# Patient Record
Sex: Female | Born: 1975 | Race: White | Hispanic: Yes | Smoking: Never smoker
Health system: Southern US, Community
[De-identification: ages and names within clinical notes are randomized; demographics above are authoritative.]

## PROBLEM LIST (undated history)

## (undated) DIAGNOSIS — M329 Systemic lupus erythematosus, unspecified: Secondary | ICD-10-CM

## (undated) DIAGNOSIS — IMO0002 Reserved for concepts with insufficient information to code with codable children: Secondary | ICD-10-CM

## (undated) DIAGNOSIS — D649 Anemia, unspecified: Secondary | ICD-10-CM

## (undated) DIAGNOSIS — N189 Chronic kidney disease, unspecified: Secondary | ICD-10-CM

## (undated) DIAGNOSIS — Z975 Presence of (intrauterine) contraceptive device: Secondary | ICD-10-CM

## (undated) DIAGNOSIS — Z8619 Personal history of other infectious and parasitic diseases: Secondary | ICD-10-CM

## (undated) HISTORY — DX: Chronic kidney disease, unspecified: N18.9

## (undated) HISTORY — DX: Systemic lupus erythematosus, unspecified: M32.9

## (undated) HISTORY — DX: Reserved for concepts with insufficient information to code with codable children: IMO0002

## (undated) HISTORY — PX: WISDOM TOOTH EXTRACTION: SHX21

---

## 2011-02-07 HISTORY — PX: RENAL BIOPSY: SHX156

## 2012-02-08 ENCOUNTER — Ambulatory Visit: Payer: Self-pay | Admitting: Pulmonary Disease

## 2012-02-08 LAB — URINALYSIS, COMPLETE
Bilirubin,UR: NEGATIVE
Ketone: NEGATIVE
Leukocyte Esterase: NEGATIVE
Nitrite: NEGATIVE
Ph: 5 (ref 4.5–8.0)
Specific Gravity: 1.012 (ref 1.003–1.030)
Squamous Epithelial: 1
WBC UR: <1 /HPF (ref 0–5)

## 2012-02-08 LAB — PROTEIN / CREATININE RATIO, URINE
Protein, Random Urine: 97 mg/dL — ABNORMAL HIGH (ref 0–12)
Protein/Creat. Ratio: 1370 mg/gCREAT — ABNORMAL HIGH (ref 0–200)

## 2012-02-08 LAB — CBC WITH DIFFERENTIAL/PLATELET
Basophil #: 0 10*3/uL (ref 0.0–0.1)
Basophil %: 0.5 %
Lymphocyte %: 26.9 %
Monocyte %: 6.1 %
Neutrophil #: 5.8 10*3/uL (ref 1.4–6.5)
Neutrophil %: 65.2 %
RBC: 4.22 10*6/uL (ref 3.80–5.20)
RDW: 13.5 % (ref 11.5–14.5)

## 2012-02-08 LAB — PROTIME-INR: Prothrombin Time: 12.2 secs (ref 11.5–14.7)

## 2012-02-08 LAB — APTT: Activated PTT: 30.1 secs (ref 23.6–35.9)

## 2012-02-10 ENCOUNTER — Observation Stay: Payer: Self-pay | Admitting: Nephrology

## 2012-02-10 LAB — URINALYSIS, COMPLETE
Bacteria: NONE SEEN
Glucose,UR: NEGATIVE mg/dL (ref 0–75)
Leukocyte Esterase: NEGATIVE
Nitrite: NEGATIVE
Protein: 100
RBC,UR: 7377 /HPF (ref 0–5)
Specific Gravity: 1.011 (ref 1.003–1.030)

## 2012-02-10 LAB — CBC WITH DIFFERENTIAL/PLATELET
Basophil #: 0.1 10*3/uL (ref 0.0–0.1)
Basophil %: 1.3 %
Eosinophil #: 0.1 10*3/uL (ref 0.0–0.7)
Lymphocyte %: 39.2 %
MCHC: 33.3 g/dL (ref 32.0–36.0)
Monocyte #: 0.2 x10 3/mm (ref 0.2–0.9)
Neutrophil %: 55.7 %
RDW: 13.8 % (ref 11.5–14.5)

## 2012-02-10 LAB — PROTEIN / CREATININE RATIO, URINE
Creatinine, Urine: 59.9 mg/dL (ref 30.0–125.0)
Protein, Random Urine: 254 mg/dL — ABNORMAL HIGH (ref 0–12)

## 2012-02-10 LAB — PROTIME-INR: INR: 0.9

## 2012-02-10 LAB — COMPREHENSIVE METABOLIC PANEL
Alkaline Phosphatase: 87 U/L (ref 50–136)
Anion Gap: 5 — ABNORMAL LOW (ref 7–16)
BUN: 14 mg/dL (ref 7–18)
Co2: 26 mmol/L (ref 21–32)
Creatinine: 0.59 mg/dL — ABNORMAL LOW (ref 0.60–1.30)
EGFR (Non-African Amer.): >60
Glucose: 89 mg/dL (ref 65–99)
Osmolality: 276 (ref 275–301)
SGOT(AST): 24 U/L (ref 15–37)
SGPT (ALT): 21 U/L (ref 12–78)
Sodium: 138 mmol/L (ref 136–145)

## 2012-04-18 ENCOUNTER — Emergency Department: Payer: Self-pay | Admitting: Emergency Medicine

## 2012-04-18 LAB — URINALYSIS, COMPLETE
Ketone: NEGATIVE
Ph: 5 (ref 4.5–8.0)
Protein: 150
Specific Gravity: 1.035 (ref 1.003–1.030)
Squamous Epithelial: 9
WBC UR: 25 /HPF (ref 0–5)

## 2012-04-18 LAB — COMPREHENSIVE METABOLIC PANEL
Alkaline Phosphatase: 61 U/L (ref 50–136)
Anion Gap: 8 (ref 7–16)
BUN: 21 mg/dL — ABNORMAL HIGH (ref 7–18)
Bilirubin,Total: 0.2 mg/dL (ref 0.2–1.0)
Calcium, Total: 8 mg/dL — ABNORMAL LOW (ref 8.5–10.1)
Creatinine: 0.69 mg/dL (ref 0.60–1.30)
Glucose: 114 mg/dL — ABNORMAL HIGH (ref 65–99)
Osmolality: 283 (ref 275–301)
Potassium: 3.9 mmol/L (ref 3.5–5.1)
Sodium: 140 mmol/L (ref 136–145)

## 2012-04-18 LAB — CBC
HGB: 12 g/dL (ref 12.0–16.0)
MCH: 27.4 pg (ref 26.0–34.0)
MCHC: 32 g/dL (ref 32.0–36.0)
MCV: 86 fL (ref 80–100)
RBC: 4.36 10*6/uL (ref 3.80–5.20)
WBC: 11.2 10*3/uL — ABNORMAL HIGH (ref 3.6–11.0)

## 2012-04-18 LAB — SEDIMENTATION RATE: Erythrocyte Sed Rate: 5 mm/hr (ref 0–20)

## 2013-03-20 ENCOUNTER — Encounter: Payer: Self-pay | Admitting: Obstetrics & Gynecology

## 2014-01-04 ENCOUNTER — Telehealth: Payer: Self-pay | Admitting: Family Medicine

## 2014-01-04 NOTE — Telephone Encounter (Signed)
Pt wanted to be seen for flare up of lupus and wanted to be seen today but hasn't been seen here since 1991. Advised pt she would need new pt appointment to re-establish care. She was seeing doctor in [place] but no longer lives there. Advised it would be best to go see that doctor for flare up as he will be able to treat her flare up better as he is aware of the Lupus and has been following her. Pt will go to [facility] and advised to CB to schedule an appointment for re-establishment of care.

## 2014-06-30 NOTE — Consult Note (Signed)
Referral Information:  Reason for Referral Management of lupus in pregnancy   Referring Physician [facility]   Prenatal Hx Meghan Foster is a 39 year-old G3 P2002 at 7 6/7 weeks by LMP of 01/23/13 (live IUP seen on US performed at [facility] on 03/14/12 with estimated measurements of 6 2/7 weeks) who presents for recommendations regarding the management of lupus in pregnancy.  Meghan Foster was diagnosed with SLE in late 2013 though reports having symptoms for a few years prior. She reports symptoms of edema and extremity pain that began shortly after her most recent delivery in 2007.  She was living in [place] and then moved to the area.  She has had renal invovlement of her lupus.  Her urine P/C ratios range from 400 mg up to 4 g over the last 1-2 years. 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 In addition to proteinuria her UA's have demonstrated micorscopic hematruia and casts.  She denies ever having elevated serum creatinines.  She is currently on Prednisone, Plaquenil and CellCept.  The CellCept has been needed to help with her kidney function. She is also taking an ACE inhibitor.  Meghan Foster is here with her mother. She understands that she was to use contraception on CellCept and is concerned not only about the effects of her medication regimen on her fetus but also the risk of the pregnancy on her health.   Past Obstetrical Hx G3 P2002 2002:  spontaneous vaginal delivery at 37 weeks in setting of SROM.  Female infant weighing 5 pounds 8 ounces. Denies complications 4196: spontaneous vaginal delivery at 40 weeks. Female infant. No complications No diabetes or preeclampsia during either pregnancy.   Home Medications: Medication Instructions Status  predniSONE 5 mg oral tablet 1  orally 2 times a day Active  hydroxychloroquine 200 mg oral tablet 1 tab(s) orally once a day Active  mycophenolate mofetil 500 mg oral tablet 3 tab(s) orally 2 times a day Active   vitamin d 50,000iu 1 cap(s)  once a month Active  ramipril 10 mg oral capsule 1 cap(s) orally once a day Active  omeprazole 40 mg oral delayed release capsule 1 cap(s) orally once a day Active   Allergies:   No Known Allergies:   Vital Signs/Notes:  Nursing Vital Signs: **Vital Signs.:   12-Jan-15 09:57  Pulse Pulse 86  Systolic BP Systolic BP 222  Diastolic BP (mmHg) Diastolic BP (mmHg) 87   Perinatal Consult:  Past Medical History cont'd 1. SLE. Diagnosed in 2013.  Complicated by renal nephritis. Urine P/C ranges from 400 mg to 4 g over last 1-2 years. Serum creatinine normal in late 2013 2. Chronic HTN.   PSurg Hx 1. Renal biopsy, December 2013;   2. Wisdom teeth extraction, no complications   FHx Denies a FH of birth defects, genetic disorders, or mentral retardation.  Has a daughter with autism.  Paternal aunts with lupus. No FH of thromboembolic phenomena   Soc Hx Single. Denies use of ETOH, tobacco or drugs   Review Of Systems:  Subjective Denies SOB or Chest pain.  Stable SLE symptoms of extremeity discomfort.   Fever/Chills No   Cough No   Abdominal Pain No   Nausea/Vomiting No   SOB/DOE No   Chest Pain No   Medications/Allergies Reviewed Medications/Allergies reviewed    Additional Lab/Radiology Notes Per [facility] Rheum note dated 12/23/11:  SSA positive, SSB negative ANA: 1: 160 (1013)  Labs [facility] (12/13): Hct 35.4, MCV 84, Plt 326,  BUN 14, Cr 0.59, ALT 21, AST 24, glucose 89  Urine P/C 1370 (02/08/12) Urine P/C 4240 (02/10/12) Urine P/C per [place] note dated 02/27/13: 788.   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  SSA positive.  Unknown anti-phospholipid antibody state   Recommendations 1.  Advanced Maternal Age.   We discussed the age-associated risks for aneuploidy and medical complications of pregnancy. We discussed the availability of genetic counseling and screening and diagnostics tests for aneuploidy.  She currently elects for pregnancy termination so does not desire genetic counseling or aneuploidy testing  2. SLE with lupus nephritis.  We discussed the impact of SLE on pregnancy and the impact of pregnancy on SLE.  Given her baseline renal invovlement, she is at risk for severe, early preeclampsia as well as for renal deterioration in pregnnacy.  It is not possible to predict if she would devlop either worsening renal function or preeclampsia.  If she did develop worsening renal fucntion, this could progress to requiring dialysis or permanent renal failure.  We discussed that the cure for preeclampsia is delivery.  Should she develop preeclampsia, this could result in the delivery of a periviable or early preterm delivery leading to life-long handicaps for the child. In addition, early-severe forms of preeclampsia can put the mother's life at risk.  Next, we discussed the association of CellCept with both first trimester loss and risk for congenital anomalies. It is not possible to predict the absolute risk for congenital anomalies as not all fetuses exposed to CellCept have anomalies.  Prednisone is associated with a small increased risk for oral clefting.  Plaquenil is though to be safe in pregnancy.  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 Maternal SSA carriage is associated with a risk for congenital heart block in the neonate.  We typically screen women from 18 to 30 weeks every 1-2 weeks with fetal echo to measure the mechanical PR interval.    Comments ...continued from above...  We discussed that should she elect to continue the pregnancy that this would be a high-risk pregnancy and that we would be happy to take care of her in our  [facility] office. We would work with her Rheumatologist to consider other immunosupresents besides CellCept that would provide her with the therapy that she needs and with lower risk for the fetus.  3. Chronic Hypertensin. Meghan Foster is currently taking an ACE Inihibtor. We discussed the associated anomalies and pregnancy complications associated with prolonged ACE Inhibitor usage in pregnancy.  After answering all of hers and her mother's questions, Meghan Foster desires to proceed wtih pregnancy termination. She states she will contact [facility] to arrange this.  If desired, we can also arrange at the [facility].  Should she elect to continue with the pregnancy, we recommned the following: -Discontinuing CellCept now.  Discuss with Dr. Jefm Bryant other potential options -Referral back for Genetic Counseling to discuss aneuploidy testing options -Detailed antaomy scan at approximately 16 weeks to screen for congential anomalies -Obtain lupus anticoagulant, anti-cardiolipin antibody and anti-beta2 glycoprotein antibody levels -Start a daily baby aspirin to decrease the risk for preeclampsia -Discontinue her ACE inhibitor and start labetalol or nifedipine -Maternal echo -Close Rheum followup during the pregnancy -24 hour urine protein and creatinine clearance -Referall to [facility] at approximately 18 weeks for fetal echo and to schedule regular fetal echo screening through the pregnancy. We can help with scheduling her fetal echos. -Thyroid function testing    Total Time Spent with Patient 60 minutes   >50% of visit spent in couseling/coordination of care yes   Office Use Only 99244  Level 4 (29mn) NEW office consult low complexity   Coding Description: OTHER: Advanced Maternal Age, Lupus in pregnancy.  Electronic Signatures: Shai Mckenzie, CMali(MD)  (Signed 12-Jan-15 13:00)  Authored: Referral, Home Medications, Allergies, Vital Signs/Notes, Consult, Exam,  Lab/Radiology Notes, Impression, Other Comments, Billing, Coding Description   Last Updated: 12-Jan-15 13:00 by Lashayla Armes, CMali(MD)

## 2014-08-22 ENCOUNTER — Other Ambulatory Visit: Payer: 59

## 2014-08-22 ENCOUNTER — Other Ambulatory Visit: Payer: Self-pay

## 2014-08-22 DIAGNOSIS — Z113 Encounter for screening for infections with a predominantly sexual mode of transmission: Secondary | ICD-10-CM

## 2014-08-22 DIAGNOSIS — Z01419 Encounter for gynecological examination (general) (routine) without abnormal findings: Secondary | ICD-10-CM

## 2014-08-24 LAB — CBC WITH DIFFERENTIAL/PLATELET
BASOS ABS: 0 10*3/uL (ref 0.0–0.2)
Basos: 0 %
EOS (ABSOLUTE): 0.1 10*3/uL (ref 0.0–0.4)
Eos: 1 %
Hematocrit: 38.8 % (ref 34.0–46.6)
Hemoglobin: 12.5 g/dL (ref 11.1–15.9)
IMMATURE GRANS (ABS): 0 10*3/uL (ref 0.0–0.1)
IMMATURE GRANULOCYTES: 0 %
LYMPHS ABS: 2.9 10*3/uL (ref 0.7–3.1)
Lymphs: 29 %
MCH: 27.4 pg (ref 26.6–33.0)
MCHC: 32.2 g/dL (ref 31.5–35.7)
MCV: 85 fL (ref 79–97)
MONOS ABS: 0.6 10*3/uL (ref 0.1–0.9)
Monocytes: 6 %
Neutrophils Absolute: 6.2 10*3/uL (ref 1.4–7.0)
Neutrophils: 64 %
PLATELETS: 401 10*3/uL — AB (ref 150–379)
RBC: 4.57 x10E6/uL (ref 3.77–5.28)
RDW: 14 % (ref 12.3–15.4)
WBC: 9.9 10*3/uL (ref 3.4–10.8)

## 2014-08-24 LAB — LIPID PANEL
CHOL/HDL RATIO: 3.4 ratio (ref 0.0–4.4)
Cholesterol, Total: 167 mg/dL (ref 100–199)
HDL: 49 mg/dL (ref >39)
LDL Calculated: 99 mg/dL (ref 0–99)
Triglycerides: 96 mg/dL (ref 0–149)
VLDL Cholesterol Cal: 19 mg/dL (ref 5–40)

## 2014-08-24 LAB — BASIC METABOLIC PANEL
BUN / CREAT RATIO: 17 (ref 9–23)
BUN: 12 mg/dL (ref 6–24)
CALCIUM: 9.2 mg/dL (ref 8.7–10.2)
CO2: 20 mmol/L (ref 18–29)
CREATININE: 0.7 mg/dL (ref 0.57–1.00)
Chloride: 101 mmol/L (ref 97–108)
GFR calc Af Amer: 125 mL/min/{1.73_m2} (ref >59)
GFR, EST NON AFRICAN AMERICAN: 109 mL/min/{1.73_m2} (ref >59)
Glucose: 85 mg/dL (ref 65–99)
POTASSIUM: 4.6 mmol/L (ref 3.5–5.2)
Sodium: 137 mmol/L (ref 134–144)

## 2014-08-24 LAB — VITAMIN D 25 HYDROXY (VIT D DEFICIENCY, FRACTURES): Vit D, 25-Hydroxy: 17.7 ng/mL — ABNORMAL LOW (ref 30.0–100.0)

## 2014-08-24 LAB — THYROID PANEL WITH TSH
FREE THYROXINE INDEX: 2.1 (ref 1.2–4.9)
T3 UPTAKE RATIO: 26 % (ref 24–39)
T4, Total: 8 ug/dL (ref 4.5–12.0)
TSH: 1.22 u[IU]/mL (ref 0.450–4.500)

## 2014-08-24 LAB — HEP, RPR, HIV PANEL
HIV Screen 4th Generation wRfx: NONREACTIVE
Hepatitis B Surface Ag: NEGATIVE
RPR Ser Ql: NONREACTIVE

## 2014-08-27 ENCOUNTER — Telehealth: Payer: Self-pay | Admitting: Obstetrics and Gynecology

## 2014-08-27 MED ORDER — VITAMIN D (ERGOCALCIFEROL) 1.25 MG (50000 UNIT) PO CAPS: 50000.0000 [IU] | ORAL_CAPSULE | ORAL | Status: DC

## 2014-08-27 NOTE — Telephone Encounter (Signed)
Pt. Informed that Vitamin D was low and to take 50,000 IU weekly x12 weeks, followed by OTC Vitamin D supplement.  Rx sent to [facility] per pt. Request.

## 2014-08-27 NOTE — Telephone Encounter (Signed)
PT CALLED AND IS A DR CHERRY PT, SHE WAS WANTING TO KNOW THE RESULTS OF HER LAB WORK,

## 2015-01-18 ENCOUNTER — Ambulatory Visit (INDEPENDENT_AMBULATORY_CARE_PROVIDER_SITE_OTHER): Payer: PRIVATE HEALTH INSURANCE

## 2015-01-18 ENCOUNTER — Ambulatory Visit (INDEPENDENT_AMBULATORY_CARE_PROVIDER_SITE_OTHER): Payer: PRIVATE HEALTH INSURANCE | Admitting: Family Medicine

## 2015-01-18 ENCOUNTER — Encounter: Payer: Self-pay | Admitting: Family Medicine

## 2015-01-18 VITALS — BP 116/86 | HR 85 | Temp 97.8°F | Ht 61.0 in | Wt 169.6 lb

## 2015-01-18 DIAGNOSIS — S9031XA Contusion of right foot, initial encounter: Secondary | ICD-10-CM | POA: Diagnosis not present

## 2015-01-18 DIAGNOSIS — M329 Systemic lupus erythematosus, unspecified: Secondary | ICD-10-CM

## 2015-01-18 NOTE — Progress Notes (Signed)
Subjective:  Patient ID: Meghan Foster, female    DOB: 10-Jul-1975  Age: 39 y.o. MRN: [id number]  CC: Establish Care and Foot Pain   HPI Meghan Foster presents for . Drawer fell on foot 4 days ago. Painful to ambulate. Hit the base of the 2-3 right toes.  Took plaquenil, prednisone and a chemo drug. Finished 1.5 years ago. Recent nephrology appt. "In remission."  History Meghan Foster has a past medical history of Lupus ([place]); Chronic kidney disease; and Lupus ([place]).   She has no past surgical history on file.   Her family history includes Diabetes in her mother.She reports that she has never smoked. She does not have any smokeless tobacco history on file. She reports that she does not drink alcohol or use illicit drugs.  No outpatient prescriptions prior to visit.   No facility-administered medications prior to visit.   Single mom 2 girls 14, 9   ROS Review of Systems  Constitutional: Negative for fever, activity change and appetite change.  HENT: Negative for congestion, rhinorrhea and sore throat.   Eyes: Negative for pain and visual disturbance.  Respiratory: Negative for cough and shortness of breath.   Gastrointestinal: Negative for nausea and abdominal pain.  Musculoskeletal: Positive for arthralgias (R foot, Currently none from lupus). Negative for myalgias.    Objective:  BP 116/86 mmHg  Pulse 85  Temp(Src) 97.8 F (36.6 C) (Oral)  Ht 5' 1"  (1.549 m)  Wt 169 lb 9.6 oz (76.93 kg)  BMI 32.06 kg/m2  SpO2 98%  BP Readings from Last 3 Encounters:  01/18/15 116/86    Wt Readings from Last 3 Encounters:  01/18/15 169 lb 9.6 oz (76.93 kg)     Physical Exam  Constitutional: She is oriented to person, place, and time. She appears well-developed and well-nourished. No distress.  HENT:  Head: Normocephalic and atraumatic.  Right Ear: External ear normal.  Left Ear: External ear normal.  Nose: Nose normal.  Mouth/Throat: Oropharynx is clear and moist.    Eyes: Conjunctivae and EOM are normal. Pupils are equal, round, and reactive to light.  Neck: Normal range of motion. Neck supple. No thyromegaly present.  Cardiovascular: Normal rate, regular rhythm and normal heart sounds.   No murmur heard. Pulmonary/Chest: Effort normal and breath sounds normal. No respiratory distress. She has no wheezes. She has no rales.  Musculoskeletal: Normal range of motion. She exhibits edema and tenderness (at dorsal right forefoot).  Lymphadenopathy:    She has no cervical adenopathy.  Neurological: She is alert and oriented to person, place, and time. She has normal reflexes.  Skin: Skin is warm and dry.  Psychiatric: She has a normal mood and affect. Her behavior is normal. Judgment and thought content normal.    No results found for: HGBA1C  No results found for: WBC, HGB, HCT, PLT, GLUCOSE, CHOL, TRIG, HDL, LDLDIRECT, LDLCALC, ALT, AST, NA, K, CL, CREATININE, BUN, CO2, TSH, PSA, INR, GLUF, HGBA1C, MICROALBUR  Patient was never admitted.  Assessment & Plan:   Amrita was seen today for establish care and foot pain.  Diagnoses and all orders for this visit:  Contusion, foot, right, initial encounter -     DG Foot Complete Right; Future -     DME Other see comment  Lupus (systemic lupus erythematosus) ([place])   Ms. Kanode does not currently have medications on file.  No orders of the defined types were placed in this encounter.   the radiologist confirmed on her x-ray that there are  no broken bones in her foot. However to promote healing she should still wear the postop shoe as much as possible until the bruising and pain go away   Follow-up: Return if symptoms worsen or fail to improve.  Meghan Foster, M.D.

## 2015-01-20 DIAGNOSIS — M329 Systemic lupus erythematosus, unspecified: Secondary | ICD-10-CM | POA: Insufficient documentation

## 2015-02-15 ENCOUNTER — Ambulatory Visit: Payer: PRIVATE HEALTH INSURANCE | Admitting: Pharmacist

## 2015-03-12 ENCOUNTER — Other Ambulatory Visit: Payer: Self-pay | Admitting: Obstetrics and Gynecology

## 2015-03-12 DIAGNOSIS — Z1231 Encounter for screening mammogram for malignant neoplasm of breast: Secondary | ICD-10-CM

## 2015-03-15 ENCOUNTER — Encounter: Payer: Self-pay | Admitting: Obstetrics and Gynecology

## 2015-03-15 ENCOUNTER — Ambulatory Visit
Admission: RE | Admit: 2015-03-15 | Discharge: 2015-03-15 | Disposition: A | Discharge status: Home / Self Care | Payer: PRIVATE HEALTH INSURANCE | Source: Ambulatory Visit | Attending: Obstetrics and Gynecology | Admitting: Obstetrics and Gynecology

## 2015-03-15 ENCOUNTER — Ambulatory Visit (INDEPENDENT_AMBULATORY_CARE_PROVIDER_SITE_OTHER): Payer: PRIVATE HEALTH INSURANCE | Admitting: Obstetrics and Gynecology

## 2015-03-15 VITALS — BP 126/87 | HR 70 | Ht 62.0 in | Wt 171.7 lb

## 2015-03-15 DIAGNOSIS — Z1231 Encounter for screening mammogram for malignant neoplasm of breast: Secondary | ICD-10-CM | POA: Insufficient documentation

## 2015-03-15 DIAGNOSIS — A630 Anogenital (venereal) warts: Secondary | ICD-10-CM

## 2015-03-16 NOTE — Progress Notes (Signed)
      GYNECOLOGY PROGRESS NOTE  Subjective:    Patient ID: Meghan Foster, female    DOB: [birth year], 40 y.o.   MRN: [id number]  HPI  Patient is a 40 y.o. P47 female who presents for treatment for genital warts.  Patient has had a h/o genital warts in the past.  Was prescribed Vaniqua previously, however notes non-compliance with medication.  Desires treatment/removal today.  The following portions of the patient's history were reviewed and updated as appropriate: allergies, current medications, past family history, past medical history, past social history, past surgical history and problem list.  Review of Systems Pertinent items noted in HPI and remainder of comprehensive ROS otherwise negative.   Objective:   Blood pressure 126/87, pulse 70, height 5' 2"  (1.575 m), weight 171 lb 11.2 oz (77.883 kg), last menstrual period 02/18/2015. General appearance: alert and no distress Abdomen: soft, non-tender; bowel sounds normal; no masses,  no organomegaly Pelvic: external genitalia normal and positive findings: genital warts present (1 noted on right vulva at level of the clitoral hood, a second noted at perianal skin).   Extremities: extremities normal, atraumatic, no cyanosis or edema Neurologic: Grossly normal   Assessment:   Vulvar condyloma  Plan:   Patient identified, verbal informed consent obtained.    Areas of typical appearing genital warts noted on right vulva at level of the clitoral hood, a second noted at perianal skin.  Surrounding area was injected with ~ 0.5 cc of lidocaine at each wart site and coated with water based lubricant.  TCA applied until warts had white appearance.   Patient tolerated the procedure well.    Post procedure instructions given and patient told to wash area in thirty minutes.  Return in 1 week for next treatment if necessary, or advised to resume [place] treatment.  Patient also to f/u in 3 months for annual exam.     Rubie Maid, MD [facility]

## 2015-03-18 ENCOUNTER — Other Ambulatory Visit: Payer: Self-pay | Admitting: Obstetrics and Gynecology

## 2015-03-18 DIAGNOSIS — R928 Other abnormal and inconclusive findings on diagnostic imaging of breast: Secondary | ICD-10-CM

## 2015-03-22 ENCOUNTER — Ambulatory Visit (INDEPENDENT_AMBULATORY_CARE_PROVIDER_SITE_OTHER): Payer: PRIVATE HEALTH INSURANCE | Admitting: Pharmacist

## 2015-03-22 ENCOUNTER — Telehealth: Payer: Self-pay | Admitting: Family Medicine

## 2015-03-22 ENCOUNTER — Encounter: Payer: Self-pay | Admitting: Pharmacist

## 2015-03-22 DIAGNOSIS — R635 Abnormal weight gain: Secondary | ICD-10-CM

## 2015-03-22 DIAGNOSIS — E669 Obesity, unspecified: Secondary | ICD-10-CM | POA: Diagnosis not present
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
## 2015-03-27 NOTE — Telephone Encounter (Signed)
Discussed started weight loss medication with patient.  I would like to postpone for at least 1 month for 2 reasons;  1 - waiting on records from nephrologist  2 - patient has surgery scheduled for January 30th and recommend waiting until after.  Patient aware.

## 2015-04-03 ENCOUNTER — Encounter: Payer: Self-pay | Admitting: Obstetrics and Gynecology

## 2015-04-03 ENCOUNTER — Ambulatory Visit
Admission: RE | Admit: 2015-04-03 | Discharge: 2015-04-03 | Disposition: A | Discharge status: Home / Self Care | Payer: PRIVATE HEALTH INSURANCE | Source: Ambulatory Visit | Attending: Obstetrics and Gynecology | Admitting: Obstetrics and Gynecology

## 2015-04-03 ENCOUNTER — Encounter
Admission: RE | Admit: 2015-04-03 | Discharge: 2015-04-03 | Disposition: A | Discharge status: Home / Self Care | Payer: PRIVATE HEALTH INSURANCE | Source: Ambulatory Visit | Attending: Obstetrics and Gynecology | Admitting: Obstetrics and Gynecology

## 2015-04-03 ENCOUNTER — Ambulatory Visit (INDEPENDENT_AMBULATORY_CARE_PROVIDER_SITE_OTHER): Payer: PRIVATE HEALTH INSURANCE | Admitting: Obstetrics and Gynecology

## 2015-04-03 VITALS — BP 121/83 | HR 76 | Ht 62.0 in | Wt 172.1 lb

## 2015-04-03 DIAGNOSIS — R928 Other abnormal and inconclusive findings on diagnostic imaging of breast: Secondary | ICD-10-CM

## 2015-04-03 DIAGNOSIS — Z308 Encounter for other contraceptive management: Secondary | ICD-10-CM | POA: Diagnosis not present

## 2015-04-03 DIAGNOSIS — N6001 Solitary cyst of right breast: Secondary | ICD-10-CM | POA: Diagnosis not present

## 2015-04-03 HISTORY — DX: Anemia, unspecified: D64.9

## 2015-04-03 HISTORY — DX: Presence of (intrauterine) contraceptive device: Z97.5

## 2015-04-03 HISTORY — DX: Personal history of other infectious and parasitic diseases: Z86.19

## 2015-04-03 LAB — CBC
HCT: 40.1 % (ref 35.0–47.0)
HEMOGLOBIN: 12.5 g/dL (ref 12.0–16.0)
MCH: 26.7 pg (ref 26.0–34.0)
MCHC: 31.2 g/dL — ABNORMAL LOW (ref 32.0–36.0)
MCV: 85.5 fL (ref 80.0–100.0)
Platelets: 366 10*3/uL (ref 150–440)
RBC: 4.7 MIL/uL (ref 3.80–5.20)
RDW: 13.4 % (ref 11.5–14.5)
WBC: 11.5 10*3/uL — ABNORMAL HIGH (ref 3.6–11.0)

## 2015-04-03 NOTE — Patient Instructions (Signed)
  Your procedure is scheduled on: April 08, 2015 (Monday) Report to Day Surgery.[facility]) Second Floor To find out your arrival time please call [phone] between 1PM - 3PM on April 05, 2015 (Friday).  Remember: Instructions that are not followed completely may result in serious medical risk, up to and including death, or upon the discretion of your surgeon and anesthesiologist your surgery may need to be rescheduled.    __x__ 1. Do not eat food or drink liquids after midnight. No gum chewing or hard candies.     __x__ 2. No Alcohol for 24 hours before or after surgery.   ____ 3. Bring all medications with you on the day of surgery if instructed.    __x__ 4. Notify your doctor if there is any change in your medical condition     (cold, fever, infections).     Do not wear jewelry, make-up, hairpins, clips or nail polish.  Do not wear lotions, powders, or perfumes. You may wear deodorant.  Do not shave 48 hours prior to surgery. Men may shave face and neck.  Do not bring valuables to the hospital.    [facility] is not responsible for any belongings or valuables.               Contacts, dentures or bridgework may not be worn into surgery.  Leave your suitcase in the car. After surgery it may be brought to your room.  For patients admitted to the hospital, discharge time is determined by your                treatment team.   Patients discharged the day of surgery will not be allowed to drive home.   Please read over the following fact sheets that you were given:   Surgical Site Infection Prevention   ____ Take these medicines the morning of surgery with A SIP OF WATER:    1.   2.   3.   4.  5.  6.  ____ Fleet Enema (as directed)   _x___ Use CHG Soap as directed  ____ Use inhalers on the day of surgery  ____ Stop metformin 2 days prior to surgery    ____ Take 1/2 of usual insulin dose the night before surgery and none on the morning of surgery.   ____ Stop  Coumadin/Plavix/aspirin on   __x__ Stop Anti-inflammatories on (Tylenol ok to take for pain if needed)   ____ Stop supplements until after surgery.    ____ Bring C-Pap to the hospital.

## 2015-04-03 NOTE — Progress Notes (Signed)
    GYNECOLOGY PROGRESS NOTE  Subjective:    Patient ID: Meghan Foster, female    DOB: Jul 14, 1975, 40 y.o.   MRN: [id number]  HPI  Patient is a 40 y.o. [id number] female who presents for pre-operative exam for tubal ligation.  Patient desires permanent sterilization.   Currently with Mirena IUD in place (x 1 year).   The following portions of the patient's history were reviewed and updated as appropriate: allergies, current medications, past family history, past medical history, past social history, past surgical history and problem list.    Review of Systems A comprehensive review of systems was negative.   Objective:   Blood pressure 121/83, pulse 76, height 5' 2"  (1.575 m), weight 172 lb 1.6 oz (78.064 kg), last menstrual period 02/18/2015. General appearance: alert and no distress Abdomen: soft, non-tender; bowel sounds normal; no masses,  no organomegaly Pelvic: cervix normal in appearance, external genitalia normal, no adnexal masses or tenderness, no cervical motion tenderness, rectovaginal septum normal, uterus normal size, shape, and consistency, vagina normal without discharge and IUD strings visualized.  Extremities: extremities normal, atraumatic, no cyanosis or edema Neurologic: Grossly normal   Assessment:   Desiring permanent sterility  Plan:  Patient desires permanent sterilization.  Other reversible forms of contraception were discussed with patient; she declines all other modalities. Risks of procedure discussed with patient including but not limited to: risk of regret, permanence of method, bleeding, infection, injury to surrounding organs and need for additional procedures.  Failure risk of 1-2 % with increased risk of ectopic gestation if pregnancy occurs was also discussed with patient.  Patient verbalized understanding of these risks and wants to proceed with sterilization.   Discussed laparoscopic BTL vs Essure.  After discussion, patient now notes that she  desires Essure sterilization.  Will remove IUD at time of surgery and will administer Depo Provera post-op to allow required time for coils to scar.    A total of 15 minutes were spent face-to-face with the patient during this encounter and over half of that time dealt with counseling and coordination of care.   Rubie Maid, MD [facility]

## 2015-04-05 NOTE — Progress Notes (Signed)
Addendum:   Patient contacted MD later the same day of visit to note that she desired to cancel her surgery

## 2015-04-08 ENCOUNTER — Encounter: Admission: RE | Payer: Self-pay | Source: Ambulatory Visit

## 2015-04-08 ENCOUNTER — Ambulatory Visit
Admission: RE | Admit: 2015-04-08 | Payer: PRIVATE HEALTH INSURANCE | Source: Ambulatory Visit | Admitting: Obstetrics and Gynecology

## 2015-04-08 SURGERY — LIGATION, FALLOPIAN TUBE, LAPAROSCOPIC
Anesthesia: Choice

## 2015-06-27 ENCOUNTER — Encounter: Payer: Self-pay | Admitting: Obstetrics and Gynecology

## 2015-08-14 ENCOUNTER — Ambulatory Visit: Payer: PRIVATE HEALTH INSURANCE | Admitting: Family

## 2015-08-19 ENCOUNTER — Ambulatory Visit: Payer: PRIVATE HEALTH INSURANCE | Admitting: Family Medicine

## 2015-08-21 ENCOUNTER — Ambulatory Visit: Payer: PRIVATE HEALTH INSURANCE | Admitting: Family Medicine

## 2015-08-22 ENCOUNTER — Encounter: Payer: Self-pay | Admitting: Family Medicine

## 2015-08-22 ENCOUNTER — Ambulatory Visit (INDEPENDENT_AMBULATORY_CARE_PROVIDER_SITE_OTHER): Payer: BLUE CROSS/BLUE SHIELD | Admitting: Family Medicine

## 2015-08-22 VITALS — BP 134/91 | HR 93 | Temp 97.2°F | Ht 62.5 in | Wt 171.0 lb

## 2015-08-22 DIAGNOSIS — R030 Elevated blood-pressure reading, without diagnosis of hypertension: Secondary | ICD-10-CM

## 2015-08-22 DIAGNOSIS — Z13220 Encounter for screening for lipoid disorders: Secondary | ICD-10-CM

## 2015-08-22 DIAGNOSIS — Z131 Encounter for screening for diabetes mellitus: Secondary | ICD-10-CM | POA: Diagnosis not present

## 2015-08-22 DIAGNOSIS — M329 Systemic lupus erythematosus, unspecified: Secondary | ICD-10-CM

## 2015-08-22 DIAGNOSIS — IMO0001 Reserved for inherently not codable concepts without codable children: Secondary | ICD-10-CM

## 2015-08-22 LAB — URINALYSIS, COMPLETE
BILIRUBIN UA: NEGATIVE
Glucose, UA: NEGATIVE
KETONES UA: NEGATIVE
Leukocytes, UA: NEGATIVE
NITRITE UA: NEGATIVE
PH UA: 7 (ref 5.0–7.5)
Protein, UA: NEGATIVE
SPEC GRAV UA: 1.01 (ref 1.005–1.030)
UUROB: 0.2 mg/dL (ref 0.2–1.0)

## 2015-08-22 LAB — MICROSCOPIC EXAMINATION

## 2015-08-22 NOTE — Progress Notes (Signed)
BP 134/91 mmHg  Pulse 93  Temp(Src) 97.2 F (36.2 C) (Oral)  Ht 5' 2.5" (1.588 m)  Wt 171 lb (77.565 kg)  BMI 30.76 kg/m2   Subjective:    Patient ID: Meghan Foster, female    DOB: Jul 27, 1975, 40 y.o.   MRN: [id number]  HPI: Meghan Foster is a 40 y.o. female presenting on 08/22/2015 for Lupus   HPI Elevated blood pressure Patient is coming in today for elevated blood pressure. Her blood pressures 134/91. She has been on blood pressure medications because of her lupus and renal disease previously but is not currently. We will have her keep track of the numbers and come back in 4 weeks. Headaches or chest pain or focal numbness or weakness or blurred vision.  Lupus Patient has known lupus and has been off her medications for about 6 months. She is to be on immune modulating medication name Plaquenil. She has not had any issues that she knows of with lupus and she has been feeling very good. She was sent here by her nephrologist to get some labs done to see if she needs to go back on those medications.  Relevant past medical, surgical, family and social history reviewed and updated as indicated. Interim medical history since our last visit reviewed. Allergies and medications reviewed and updated.  Review of Systems  Constitutional: Negative for fever and chills.  HENT: Negative for congestion, ear discharge and ear pain.   Eyes: Negative for redness and visual disturbance.  Respiratory: Negative for chest tightness and shortness of breath.   Cardiovascular: Negative for chest pain and leg swelling.  Genitourinary: Negative for dysuria and difficulty urinating.  Musculoskeletal: Negative for back pain, arthralgias and gait problem.  Skin: Negative for color change and rash.  Neurological: Negative for dizziness, light-headedness and headaches.  Psychiatric/Behavioral: Negative for behavioral problems and agitation.  All other systems reviewed and are negative.   Per HPI  unless specifically indicated above     Medication List    Notice  As of 08/22/2015  3:32 PM   You have not been prescribed any medications.         Objective:    BP 134/91 mmHg  Pulse 93  Temp(Src) 97.2 F (36.2 C) (Oral)  Ht 5' 2.5" (1.588 m)  Wt 171 lb (77.565 kg)  BMI 30.76 kg/m2  Wt Readings from Last 3 Encounters:  08/22/15 171 lb (77.565 kg)  04/03/15 172 lb (78.019 kg)  04/03/15 172 lb 1.6 oz (78.064 kg)    Physical Exam  Constitutional: She is oriented to person, place, and time. She appears well-developed and well-nourished. No distress.  Eyes: Conjunctivae and EOM are normal. Pupils are equal, round, and reactive to light.  Neck: Neck supple. No thyromegaly present.  Cardiovascular: Normal rate, regular rhythm, normal heart sounds and intact distal pulses.   No murmur heard. Pulmonary/Chest: Effort normal and breath sounds normal. No respiratory distress. She has no wheezes.  Musculoskeletal: Normal range of motion. She exhibits no edema or tenderness.  Lymphadenopathy:    She has no cervical adenopathy.  Neurological: She is alert and oriented to person, place, and time. Coordination normal.  Skin: Skin is warm and dry. No rash noted. She is not diaphoretic.  Psychiatric: She has a normal mood and affect. Her behavior is normal.  Nursing note and vitals reviewed.     Assessment & Plan:   Problem List Items Addressed This Visit      Other   Lupus (systemic  lupus erythematosus) (HCC) - Primary   Relevant Orders   CMP14+EGFR   Urinalysis, Complete   Microalbumin / creatinine urine ratio   CBC with Differential/Platelet    Other Visit Diagnoses    Screening for diabetes mellitus        Relevant Orders    CMP14+EGFR    Screening for cholesterol level        Relevant Orders    Lipid panel    Elevated blood pressure            Follow up plan: Return in about 4 weeks (around 09/19/2015), or if symptoms worsen or fail to improve, for BP  recheck.  Counseling provided for all of the vaccine components Orders Placed This Encounter  Procedures  . CMP14+EGFR  . Urinalysis, Complete  . Microalbumin / creatinine urine ratio  . CBC with Differential/Platelet  . Lipid panel    Caryl Pina, MD [facility] 08/22/2015, 3:32 PM

## 2015-08-23 ENCOUNTER — Telehealth: Payer: Self-pay | Admitting: Family Medicine

## 2015-08-23 LAB — CBC WITH DIFFERENTIAL/PLATELET
Basophils Absolute: 0 10*3/uL (ref 0.0–0.2)
Basos: 0 %
EOS (ABSOLUTE): 0.1 10*3/uL (ref 0.0–0.4)
EOS: 1 %
HEMATOCRIT: 37.7 % (ref 34.0–46.6)
HEMOGLOBIN: 12.3 g/dL (ref 11.1–15.9)
IMMATURE GRANULOCYTES: 0 %
Immature Grans (Abs): 0 10*3/uL (ref 0.0–0.1)
Lymphocytes Absolute: 3.5 10*3/uL — ABNORMAL HIGH (ref 0.7–3.1)
Lymphs: 32 %
MCH: 27.2 pg (ref 26.6–33.0)
MCHC: 32.6 g/dL (ref 31.5–35.7)
MCV: 83 fL (ref 79–97)
MONOCYTES: 7 %
Monocytes Absolute: 0.7 10*3/uL (ref 0.1–0.9)
NEUTROS PCT: 60 %
Neutrophils Absolute: 6.4 10*3/uL (ref 1.4–7.0)
Platelets: 366 10*3/uL (ref 150–379)
RBC: 4.52 x10E6/uL (ref 3.77–5.28)
RDW: 14.1 % (ref 12.3–15.4)
WBC: 10.7 10*3/uL (ref 3.4–10.8)

## 2015-08-23 LAB — LIPID PANEL
CHOL/HDL RATIO: 4.3 ratio (ref 0.0–4.4)
Cholesterol, Total: 169 mg/dL (ref 100–199)
HDL: 39 mg/dL — AB (ref >39)
LDL CALC: 75 mg/dL (ref 0–99)
TRIGLYCERIDES: 273 mg/dL — AB (ref 0–149)
VLDL Cholesterol Cal: 55 mg/dL — ABNORMAL HIGH (ref 5–40)

## 2015-08-23 LAB — CMP14+EGFR
ALBUMIN: 4.4 g/dL (ref 3.5–5.5)
ALT: 21 IU/L (ref 0–32)
AST: 23 IU/L (ref 0–40)
Albumin/Globulin Ratio: 1.6 (ref 1.2–2.2)
Alkaline Phosphatase: 78 IU/L (ref 39–117)
BILIRUBIN TOTAL: 0.2 mg/dL (ref 0.0–1.2)
BUN / CREAT RATIO: 24 — AB (ref 9–23)
BUN: 15 mg/dL (ref 6–24)
CO2: 23 mmol/L (ref 18–29)
CREATININE: 0.63 mg/dL (ref 0.57–1.00)
Calcium: 8.9 mg/dL (ref 8.7–10.2)
Chloride: 99 mmol/L (ref 96–106)
GFR calc non Af Amer: 112 mL/min/{1.73_m2} (ref >59)
GFR, EST AFRICAN AMERICAN: 129 mL/min/{1.73_m2} (ref >59)
GLUCOSE: 88 mg/dL (ref 65–99)
Globulin, Total: 2.8 g/dL (ref 1.5–4.5)
Potassium: 4.6 mmol/L (ref 3.5–5.2)
Sodium: 137 mmol/L (ref 134–144)
TOTAL PROTEIN: 7.2 g/dL (ref 6.0–8.5)

## 2015-08-23 LAB — MICROALBUMIN / CREATININE URINE RATIO
Creatinine, Urine: 16.8 mg/dL
MICROALB/CREAT RATIO: <17.9 mg/g creat (ref 0.0–30.0)
Microalbumin, Urine: <3 ug/mL

## 2015-09-23 ENCOUNTER — Ambulatory Visit: Payer: BLUE CROSS/BLUE SHIELD | Admitting: Family Medicine

## 2016-03-17 ENCOUNTER — Other Ambulatory Visit: Payer: Self-pay | Admitting: Obstetrics and Gynecology

## 2016-03-17 ENCOUNTER — Telehealth: Payer: Self-pay | Admitting: Obstetrics and Gynecology

## 2016-03-17 DIAGNOSIS — Z1239 Encounter for other screening for malignant neoplasm of breast: Secondary | ICD-10-CM

## 2016-03-17 NOTE — Telephone Encounter (Signed)
Pt wanted to see if you could put order in Epic for a screening mammo so that when she comes in on 2/6 for her scheduled AE she can get a mammo that too since she live far away.

## 2016-03-18 NOTE — Telephone Encounter (Signed)
Great, thank you, I will contact pt to let her know

## 2016-04-10 NOTE — Progress Notes (Signed)
GYNECOLOGY ANNUAL PHYSICAL EXAM PROGRESS NOTE  Subjective:    Meghan Foster is a 41 y.o. G63P2012 female who presents for an annual exam. The patient has no complaints today. The patient is sexually active. The patient wears seatbelts: yes. The patient participates in regular exercise: no. Has the patient ever been transfused or tattooed?: no. The patient reports that there is not domestic violence in her life.    Gynecologic History Menarche age: 89 No LMP recorded. Patient is not currently having periods (Reason: IUD).  Contraception: IUD History of STI's: Denies Last Pap: over 3 years. Results were: normal.  Denies h/o abnormal pap smears. Last mammogram: 03/2015. Results were: abnormal (BIRADS 0) with asymmetry of left breast), followed by normal left breast diagnostic exam.    Obstetric History   G3   P2   T2   P0   A1   L2    SAB0   TAB1   Ectopic0   Multiple0   Live Births2     # Outcome Date GA Lbr Len/2nd Weight Sex Delivery Anes PTL Lv  3 TAB 2015          2 Term 2007       N LIV  1 Term 2002     Vag-Spont  N LIV    Obstetric Comments  G2 - with autism    Past Medical History:  Diagnosis Date  . Anemia    iron deficiency  . Chronic kidney disease    [facility]  . History of genital warts   . IUD (intrauterine device) in place   . Lupus   . Lupus    renal involvement wiht HTN & edema    Past Surgical History:  Procedure Laterality Date  . RENAL BIOPSY Left December 2012   [facility]    Family History  Problem Relation Age of Onset  . Diabetes Mother   . Breast cancer Paternal Aunt     Social History   Social History  . Marital status: Single    Spouse name: N/A  . Number of children: N/A  . Years of education: N/A   Occupational History  . Not on file.   Social History Main Topics  . Smoking status: Never Smoker  . Smokeless tobacco: Never Used  . Alcohol use No  . Drug use: No  . Sexual activity: Yes    Birth control/  protection: IUD   Other Topics Concern  . Not on file   Social History Narrative  . No narrative on file    No current outpatient prescriptions on file prior to visit.   No current facility-administered medications on file prior to visit.     No Known Allergies   Review of Systems Constitutional: negative for chills, fatigue, fevers and sweats Eyes: negative for irritation, redness and visual disturbance Ears, nose, mouth, throat, and face: negative for hearing loss, nasal congestion, snoring and tinnitus Respiratory: negative for asthma, cough, sputum Cardiovascular: negative for chest pain, dyspnea, exertional chest pressure/discomfort, irregular heart beat, palpitations and syncope Gastrointestinal: negative for abdominal pain, change in bowel habits, nausea and vomiting Genitourinary: negative for abnormal menstrual periods, genital lesions, sexual problems and vaginal discharge, dysuria and urinary incontinence Integument/breast: negative for breast lump, breast tenderness and nipple discharge Hematologic/lymphatic: negative for bleeding and easy bruising Musculoskeletal:negative for back pain and muscle weakness Neurological: negative for dizziness, headaches, vertigo and weakness Endocrine: negative for diabetic symptoms including polydipsia, polyuria and skin dryness Allergic/Immunologic: negative for  hay fever and urticaria       Objective:  Blood pressure 121/80, pulse 78, height 5' 2"  (1.575 m), weight 158 lb (71.7 kg). Body mass index is 28.9 kg/m.     General Appearance:    Alert, cooperative, no distress, appears stated age  Head:    Normocephalic, without obvious abnormality, atraumatic  Eyes:    PERRL, conjunctiva/corneas clear, EOM's intact, both eyes  Ears:    Normal external ear canals, both ears  Nose:   Nares normal, septum midline, mucosa normal, no drainage or sinus tenderness  Throat:   Lips, mucosa, and tongue normal; teeth and gums normal  Neck:    Supple, symmetrical, trachea midline, no adenopathy; thyroid: no enlargement/tenderness/nodules; no carotid bruit or JVD  Back:     Symmetric, no curvature, ROM normal, no CVA tenderness  Lungs:     Clear to auscultation bilaterally, respirations unlabored  Chest Wall:    No tenderness or deformity   Heart:    Regular rate and rhythm, S1 and S2 normal, no murmur, rub or gallop  Breast Exam:    No tenderness, masses, or nipple abnormality  Abdomen:     Soft, non-tender, bowel sounds active all four quadrants, no masses, no organomegaly.    Genitalia:    Pelvic:external genitalia normal, vagina without lesions, discharge, or tenderness, rectovaginal septum  normal. Cervix normal in appearance, no cervical motion tenderness, no adnexal masses or tenderness. IUD threads not easily visualized.  Tip of strings visible just inside cervical canal.  Attempted to sweep strings with cytobrush but unsuccessful.   Uterus normal size, shape, mobile, regular contours, nontender.  Rectal:    Normal external sphincter.  No hemorrhoids appreciated. Internal exam not done.   Extremities:   Extremities normal, atraumatic, no cyanosis or edema  Pulses:   2+ and symmetric all extremities  Skin:   Skin color, texture, turgor normal, no rashes or lesions  Lymph nodes:   Cervical, supraclavicular, and axillary nodes normal  Neurologic:   CNII-XII intact, normal strength, sensation and reflexes throughout   .  Labs:  Lab Results  Component Value Date   WBC 10.7 08/22/2015   HGB 12.5 04/03/2015   HCT 37.7 08/22/2015   MCV 83 08/22/2015   PLT 366 08/22/2015    Lab Results  Component Value Date   CREATININE 0.63 08/22/2015   BUN 15 08/22/2015   NA 137 08/22/2015   K 4.6 08/22/2015   CL 99 08/22/2015   CO2 23 08/22/2015    Lab Results  Component Value Date   ALT 21 08/22/2015   AST 23 08/22/2015   ALKPHOS 78 08/22/2015   BILITOT 0.2 08/22/2015    Lab Results  Component Value Date   TSH 1.220  08/22/2014     Assessment:    Healthy female exam.   H/o lupus erythematous with sequela Cervical cancer screening Breast cancer screening H/o Genital warts IUD threads lost  Plan:     Blood tests: None ordered.  Up to date.  Has an appt with PCP in 2 months. . Breast self exam technique reviewed and patient encouraged to perform self-exam monthly. Contraception: Mirena IUD Discussed healthy lifestyle modifications. Mammogram scheduled for later today. Pap smear performed today.     Genital warts.  Patient with h/o warts, states that she could feel a wart trying to develop.  No visible warts on today's exam.  Discussed treatment options, patient has had TCA in the past. Mentioned Aldara cream for management of  small warts.  Patient would like a prescription.  IUD thread barely visible on today's exam, have retracted into the cervical canal.  Patient remains asymptomatic and menstrual cycles are scant (patient usually has heavier cycles) asymptomatic, not likely out of place. If symptoms occur or strings no longer visible, will need ultrasound to visualize location.  Follow up in 1 year.     Rubie Maid, MD [facility]

## 2016-04-14 ENCOUNTER — Ambulatory Visit (INDEPENDENT_AMBULATORY_CARE_PROVIDER_SITE_OTHER): Payer: BLUE CROSS/BLUE SHIELD | Admitting: Obstetrics and Gynecology

## 2016-04-14 ENCOUNTER — Ambulatory Visit
Admission: RE | Admit: 2016-04-14 | Discharge: 2016-04-14 | Disposition: A | Discharge status: Home / Self Care | Payer: BLUE CROSS/BLUE SHIELD | Source: Ambulatory Visit | Attending: Obstetrics and Gynecology | Admitting: Obstetrics and Gynecology

## 2016-04-14 ENCOUNTER — Encounter: Payer: Self-pay | Admitting: Obstetrics and Gynecology

## 2016-04-14 VITALS — BP 121/80 | HR 78 | Ht 62.0 in | Wt 158.0 lb

## 2016-04-14 DIAGNOSIS — Z01419 Encounter for gynecological examination (general) (routine) without abnormal findings: Secondary | ICD-10-CM | POA: Diagnosis not present

## 2016-04-14 DIAGNOSIS — Z124 Encounter for screening for malignant neoplasm of cervix: Secondary | ICD-10-CM | POA: Diagnosis not present

## 2016-04-14 DIAGNOSIS — M3214 Glomerular disease in systemic lupus erythematosus: Secondary | ICD-10-CM

## 2016-04-14 DIAGNOSIS — Z1231 Encounter for screening mammogram for malignant neoplasm of breast: Secondary | ICD-10-CM | POA: Diagnosis not present

## 2016-04-14 DIAGNOSIS — R928 Other abnormal and inconclusive findings on diagnostic imaging of breast: Secondary | ICD-10-CM | POA: Diagnosis not present

## 2016-04-14 DIAGNOSIS — Z1239 Encounter for other screening for malignant neoplasm of breast: Secondary | ICD-10-CM

## 2016-04-14 DIAGNOSIS — T8332XS Displacement of intrauterine contraceptive device, sequela: Secondary | ICD-10-CM

## 2016-04-14 DIAGNOSIS — A630 Anogenital (venereal) warts: Secondary | ICD-10-CM | POA: Diagnosis not present

## 2016-04-14 MED ORDER — IMIQUIMOD 5 % EX CREA: TOPICAL_CREAM | CUTANEOUS | 0 refills | Status: DC

## 2016-04-14 NOTE — Patient Instructions (Signed)

## 2016-04-16 ENCOUNTER — Other Ambulatory Visit: Payer: Self-pay | Admitting: Obstetrics and Gynecology

## 2016-04-16 DIAGNOSIS — N6310 Unspecified lump in the right breast, unspecified quadrant: Secondary | ICD-10-CM

## 2016-04-16 DIAGNOSIS — R928 Other abnormal and inconclusive findings on diagnostic imaging of breast: Secondary | ICD-10-CM

## 2016-04-17 ENCOUNTER — Telehealth: Payer: Self-pay

## 2016-04-17 DIAGNOSIS — N6310 Unspecified lump in the right breast, unspecified quadrant: Secondary | ICD-10-CM

## 2016-04-17 DIAGNOSIS — R928 Other abnormal and inconclusive findings on diagnostic imaging of breast: Secondary | ICD-10-CM

## 2016-04-17 DIAGNOSIS — R9230 Dense breasts, unspecified: Secondary | ICD-10-CM

## 2016-04-17 DIAGNOSIS — R922 Inconclusive mammogram: Secondary | ICD-10-CM

## 2016-04-17 LAB — PAP IG AND HPV HIGH-RISK
HPV, HIGH-RISK: POSITIVE — AB
PAP SMEAR COMMENT: 0

## 2016-04-17 NOTE — Telephone Encounter (Signed)
-----   Message from Rubie Maid, MD sent at 04/17/2016  2:49 PM EST ----- Patient informed of results. Please place order for diagnostic mammogram and ultrasound of the right breast.

## 2016-04-17 NOTE — Telephone Encounter (Signed)
Called pt no answer. LM for pt informing her that she needed diagnostic mammo and u/s of right breast. Orders placed.

## 2016-04-23 ENCOUNTER — Ambulatory Visit
Admission: RE | Admit: 2016-04-23 | Discharge: 2016-04-23 | Disposition: A | Discharge status: Home / Self Care | Payer: BLUE CROSS/BLUE SHIELD | Source: Ambulatory Visit | Attending: Obstetrics and Gynecology | Admitting: Obstetrics and Gynecology

## 2016-04-23 DIAGNOSIS — R928 Other abnormal and inconclusive findings on diagnostic imaging of breast: Secondary | ICD-10-CM | POA: Diagnosis present

## 2016-04-23 DIAGNOSIS — R922 Inconclusive mammogram: Secondary | ICD-10-CM | POA: Diagnosis not present

## 2016-04-23 DIAGNOSIS — N6001 Solitary cyst of right breast: Secondary | ICD-10-CM | POA: Insufficient documentation

## 2016-04-23 DIAGNOSIS — N6310 Unspecified lump in the right breast, unspecified quadrant: Secondary | ICD-10-CM

## 2017-01-13 ENCOUNTER — Ambulatory Visit: Payer: BLUE CROSS/BLUE SHIELD

## 2017-04-20 ENCOUNTER — Encounter: Payer: Self-pay | Admitting: Obstetrics and Gynecology

## 2017-05-07 ENCOUNTER — Ambulatory Visit: Payer: 59 | Admitting: Family Medicine

## 2017-05-07 ENCOUNTER — Encounter: Payer: Self-pay | Admitting: Family Medicine

## 2017-05-07 VITALS — BP 122/78 | HR 76 | Temp 99.4°F | Ht 62.0 in | Wt 165.0 lb

## 2017-05-07 DIAGNOSIS — Z0000 Encounter for general adult medical examination without abnormal findings: Secondary | ICD-10-CM | POA: Diagnosis not present

## 2017-05-07 DIAGNOSIS — Z13220 Encounter for screening for lipoid disorders: Secondary | ICD-10-CM

## 2017-05-07 DIAGNOSIS — M329 Systemic lupus erythematosus, unspecified: Secondary | ICD-10-CM

## 2017-05-07 LAB — MICROSCOPIC EXAMINATION: Renal Epithel, UA: NONE SEEN /hpf

## 2017-05-07 LAB — URINALYSIS, COMPLETE
BILIRUBIN UA: NEGATIVE
Glucose, UA: NEGATIVE
KETONES UA: NEGATIVE
LEUKOCYTES UA: NEGATIVE
Nitrite, UA: NEGATIVE
Protein, UA: NEGATIVE
Specific Gravity, UA: 1.02 (ref 1.005–1.030)
Urobilinogen, Ur: 2 mg/dL — ABNORMAL HIGH (ref 0.2–1.0)
pH, UA: 7 (ref 5.0–7.5)

## 2017-05-07 NOTE — Progress Notes (Signed)
BP 122/78   Pulse 76   Temp 99.4 F (37.4 C) (Oral)   Ht 5' 2"  (1.575 m)   Wt 165 lb (74.8 kg)   BMI 30.18 kg/m    Subjective:    Patient ID: Meghan Foster, female    DOB: March 30, 1975, 42 y.o.   MRN: [id number]  HPI: Meghan Foster is a 42 y.o. female presenting on 05/07/2017 for Labwork (history of Lupus, has not had labs in a wile) and Discuss "microderming" (of eyebrows, would you recommend with Lupus?)   HPI Annual lab work and recheck for lupus Patient is coming in today for annual lab exam and recheck for lupus.  She typically says that her rheumatologist wants her to get renal function every year to make sure that the lupus is not damaging her renal function.  She did this last year with her rheumatology doctor in October and showed me the labs on the patient portal that she has through his office which were all normal except the lupus markers.  Patient is coming in today because she wants to have these labs done and get her annual checkup and other labs that she might need.  She denies any major health issues or health concerns.  She says her lupus is going very well joint issues.  Relevant past medical, surgical, family and social history reviewed and updated as indicated. Interim medical history since our last visit reviewed. Allergies and medications reviewed and updated.  Review of Systems  Constitutional: Negative for chills and fever.  Respiratory: Negative for cough, shortness of breath and wheezing.   Cardiovascular: Negative for chest pain, palpitations and leg swelling.  Gastrointestinal: Negative for abdominal pain, blood in stool, constipation and diarrhea.  Genitourinary: Negative for dysuria and hematuria.  Musculoskeletal: Negative for arthralgias, back pain and myalgias.  Skin: Negative for rash.  Neurological: Negative for dizziness, weakness and headaches.  Psychiatric/Behavioral: Negative for suicidal ideas.    Per HPI unless specifically indicated  above   Allergies as of 05/07/2017   No Known Allergies     Medication List    as of 05/07/2017 12:22 PM   You have not been prescribed any medications.        Objective:    BP 122/78   Pulse 76   Temp 99.4 F (37.4 C) (Oral)   Ht 5' 2"  (1.575 m)   Wt 165 lb (74.8 kg)   BMI 30.18 kg/m   Wt Readings from Last 3 Encounters:  05/07/17 165 lb (74.8 kg)  04/14/16 158 lb (71.7 kg)  08/22/15 171 lb (77.6 kg)    Physical Exam  Constitutional: She is oriented to person, place, and time. She appears well-developed and well-nourished. No distress.  Eyes: Conjunctivae are normal.  Neck: Neck supple. No thyromegaly present.  Cardiovascular: Normal rate, regular rhythm, normal heart sounds and intact distal pulses.  No murmur heard. Pulmonary/Chest: Effort normal and breath sounds normal. No respiratory distress. She has no wheezes.  Musculoskeletal: Normal range of motion. She exhibits no edema or tenderness.  Lymphadenopathy:    She has no cervical adenopathy.  Neurological: She is alert and oriented to person, place, and time. Coordination normal.  Skin: Skin is warm and dry. No rash noted. She is not diaphoretic.  Psychiatric: She has a normal mood and affect. Her behavior is normal.  Nursing note and vitals reviewed.   Urinalysis: 0-5 WBCs, 0-2 RBCs, greater than 10 epithelial cells, many bacteria, trace blood    Assessment & Plan:  Problem List Items Addressed This Visit      Other   Lupus (systemic lupus erythematosus) ([place])   Relevant Orders   Urinalysis, Complete   Microalbumin / creatinine urine ratio   CBC with Differential/Platelet   Renal Function Panel    Other Visit Diagnoses    Well adult exam    -  Primary   Relevant Orders   Urinalysis, Complete   Microalbumin / creatinine urine ratio   CBC with Differential/Platelet   Lipid panel   Renal Function Panel   Lipid screening       Relevant Orders   Lipid panel      Urinalysis unlikely  infectious  Follow up plan: Return in about 1 year (around 05/08/2018), or if symptoms worsen or fail to improve.  Counseling provided for all of the vaccine components Orders Placed This Encounter  Procedures  . Urinalysis, Complete  . Microalbumin / creatinine urine ratio  . CBC with Differential/Platelet  . Lipid panel  . Renal Function Panel    Caryl Pina, MD [facility] 05/07/2017, 12:22 PM

## 2017-05-08 LAB — RENAL FUNCTION PANEL
ALBUMIN: 4.2 g/dL (ref 3.5–5.5)
BUN/Creatinine Ratio: 15 (ref 9–23)
BUN: 11 mg/dL (ref 6–24)
CALCIUM: 8.7 mg/dL (ref 8.7–10.2)
CHLORIDE: 103 mmol/L (ref 96–106)
CO2: 23 mmol/L (ref 20–29)
Creatinine, Ser: 0.72 mg/dL (ref 0.57–1.00)
GFR calc Af Amer: 119 mL/min/{1.73_m2} (ref >59)
GFR calc non Af Amer: 104 mL/min/{1.73_m2} (ref >59)
Glucose: 79 mg/dL (ref 65–99)
PHOSPHORUS: 2.7 mg/dL (ref 2.5–4.5)
Potassium: 4.6 mmol/L (ref 3.5–5.2)
Sodium: 138 mmol/L (ref 134–144)

## 2017-05-08 LAB — CBC WITH DIFFERENTIAL/PLATELET
BASOS ABS: 0 10*3/uL (ref 0.0–0.2)
Basos: 0 %
EOS (ABSOLUTE): 0.1 10*3/uL (ref 0.0–0.4)
Eos: 1 %
Hematocrit: 37 % (ref 34.0–46.6)
Hemoglobin: 11.9 g/dL (ref 11.1–15.9)
Immature Grans (Abs): 0 10*3/uL (ref 0.0–0.1)
Immature Granulocytes: 0 %
LYMPHS ABS: 3 10*3/uL (ref 0.7–3.1)
LYMPHS: 33 %
MCH: 27.5 pg (ref 26.6–33.0)
MCHC: 32.2 g/dL (ref 31.5–35.7)
MCV: 86 fL (ref 79–97)
Monocytes Absolute: 0.8 10*3/uL (ref 0.1–0.9)
Monocytes: 9 %
NEUTROS ABS: 5.3 10*3/uL (ref 1.4–7.0)
Neutrophils: 57 %
PLATELETS: 395 10*3/uL — AB (ref 150–379)
RBC: 4.32 x10E6/uL (ref 3.77–5.28)
RDW: 13.6 % (ref 12.3–15.4)
WBC: 9.3 10*3/uL (ref 3.4–10.8)

## 2017-05-08 LAB — LIPID PANEL
CHOLESTEROL TOTAL: 176 mg/dL (ref 100–199)
Chol/HDL Ratio: 3.6 ratio (ref 0.0–4.4)
HDL: 49 mg/dL (ref >39)
LDL Calculated: 103 mg/dL — ABNORMAL HIGH (ref 0–99)
Triglycerides: 121 mg/dL (ref 0–149)
VLDL Cholesterol Cal: 24 mg/dL (ref 5–40)

## 2017-05-08 LAB — MICROALBUMIN / CREATININE URINE RATIO
Creatinine, Urine: 187.1 mg/dL
MICROALB/CREAT RATIO: 6.4 mg/g{creat} (ref 0.0–30.0)
MICROALBUM., U, RANDOM: 11.9 ug/mL

## 2017-05-12 ENCOUNTER — Telehealth: Payer: Self-pay | Admitting: Family Medicine

## 2017-05-19 ENCOUNTER — Encounter: Payer: Self-pay | Admitting: Obstetrics and Gynecology

## 2017-08-13 ENCOUNTER — Telehealth: Payer: Self-pay | Admitting: Obstetrics and Gynecology

## 2017-08-13 DIAGNOSIS — Z1239 Encounter for other screening for malignant neoplasm of breast: Secondary | ICD-10-CM

## 2017-08-13 NOTE — Telephone Encounter (Signed)
Pt was called and informed that her mammogram order was sent in to [facility].

## 2017-08-13 NOTE — Telephone Encounter (Signed)
The patient called and requested to have her mammogram order put in so that she is able to schedule her mammogram on the same day as her annual exam. The patient would also lie a call back to confirm the order has been put in and that she is able to schedule. Please advise.

## 2017-08-30 ENCOUNTER — Ambulatory Visit (HOSPITAL_COMMUNITY)
Admission: RE | Admit: 2017-08-30 | Discharge: 2017-08-30 | Disposition: A | Discharge status: Home / Self Care | Payer: 59 | Source: Ambulatory Visit | Attending: Obstetrics and Gynecology | Admitting: Obstetrics and Gynecology

## 2017-08-30 DIAGNOSIS — Z1231 Encounter for screening mammogram for malignant neoplasm of breast: Secondary | ICD-10-CM | POA: Insufficient documentation

## 2017-08-30 DIAGNOSIS — Z1239 Encounter for other screening for malignant neoplasm of breast: Secondary | ICD-10-CM

## 2017-09-14 ENCOUNTER — Encounter: Payer: Self-pay | Admitting: Obstetrics and Gynecology

## 2018-03-18 DIAGNOSIS — H1132 Conjunctival hemorrhage, left eye: Secondary | ICD-10-CM | POA: Diagnosis not present

## 2018-03-24 ENCOUNTER — Ambulatory Visit: Payer: BLUE CROSS/BLUE SHIELD | Admitting: Family Medicine

## 2018-03-24 ENCOUNTER — Encounter: Payer: Self-pay | Admitting: Family Medicine

## 2018-03-24 VITALS — BP 130/87 | HR 75 | Temp 97.6°F | Ht 62.0 in | Wt 167.4 lb

## 2018-03-24 DIAGNOSIS — H1132 Conjunctival hemorrhage, left eye: Secondary | ICD-10-CM | POA: Diagnosis not present

## 2018-03-24 DIAGNOSIS — Z136 Encounter for screening for cardiovascular disorders: Secondary | ICD-10-CM | POA: Diagnosis not present

## 2018-03-24 DIAGNOSIS — E669 Obesity, unspecified: Secondary | ICD-10-CM | POA: Diagnosis not present

## 2018-03-24 DIAGNOSIS — M3214 Glomerular disease in systemic lupus erythematosus: Secondary | ICD-10-CM | POA: Diagnosis not present

## 2018-03-24 MED ORDER — NALTREXONE-BUPROPION HCL ER 8-90 MG PO TB12: 2.0000 | ORAL_TABLET | Freq: Two times a day (BID) | ORAL | 2 refills | Status: DC

## 2018-03-24 MED ORDER — NALTREXONE-BUPROPION HCL ER 8-90 MG PO TB12: ORAL_TABLET | ORAL | 0 refills | Status: DC

## 2018-03-24 NOTE — Progress Notes (Signed)
BP 130/87   Pulse 75   Temp 97.6 F (36.4 C) (Oral)   Ht 5' 2"  (1.575 m)   Wt 167 lb 6.4 oz (75.9 kg)   BMI 30.62 kg/m    Subjective:    Patient ID: Meghan Foster, female    DOB: 11-30-1975, 43 y.o.   MRN: [id number]  HPI: Meghan Foster is a 43 y.o. female presenting on 03/24/2018 for Lupus (would like labs)   HPI Blood in her eye Patient comes in complaining of blood and bleeding in her right eye.  She says she noticed this first 1 week ago.  She says she did going to see an eye doctor recently looked at her eye and did not find any trauma or major issue that could cause it.  She says it is improving mood is still there and she does want to come get things checked out to make sure nothing else was going on.  She denies any major headaches or vision changes or any focal numbness or weakness.  She denies any pain in the eye itself or drainage.  Lupus recheck Patient is not currently on any medications for lupus and she has had some mild flares with joint issues but nothing major like she is to have and she just wants to come in to get labs rechecked and if they are elevated then she will consider following up with her nephrologist again was helping her manage this.  She used to be on Plaquenil but she is not on it anymore and has not been for year.  Obesity and weight Patient says she has had a lot of issues with obesity and weight and especially cravings especially for sweets and sugary things.  She says she will frequently be going around the office trying to find sweets and then will often eat a whole bag of them rather than just 1 or 2.  Relevant past medical, surgical, family and social history reviewed and updated as indicated. Interim medical history since our last visit reviewed. Allergies and medications reviewed and updated.  Review of Systems  Constitutional: Negative for chills and fever.  HENT: Negative for congestion, ear discharge, ear pain, sinus pressure and  sinus pain.   Eyes: Positive for redness. Negative for pain, discharge, itching and visual disturbance.  Respiratory: Negative for chest tightness and shortness of breath.   Cardiovascular: Negative for chest pain and leg swelling.  Musculoskeletal: Negative for back pain and gait problem.  Skin: Negative for rash.  Neurological: Negative for light-headedness and headaches.  Psychiatric/Behavioral: Negative for agitation and behavioral problems.  All other systems reviewed and are negative.   Per HPI unless specifically indicated above   Allergies as of 03/24/2018   No Known Allergies     Medication List       Accurate as of March 24, 2018  9:20 AM. Always use your most recent med list.        Naltrexone-buPROPion HCl ER 8-90 MG Tb12 Commonly known as:  CONTRAVE 1 tablet Po Qam x7d ; then 1 tab po BID x 7 d; then 2 tab in AM and 1 in PM X7d; Then 2 po BID from then on   Naltrexone-buPROPion HCl ER 8-90 MG Tb12 Commonly known as:  CONTRAVE Take 2 tablets by mouth 2 (two) times daily.          Objective:    BP 130/87   Pulse 75   Temp 97.6 F (36.4 C) (Oral)   Ht 5'  2" (1.575 m)   Wt 167 lb 6.4 oz (75.9 kg)   BMI 30.62 kg/m   Wt Readings from Last 3 Encounters:  03/24/18 167 lb 6.4 oz (75.9 kg)  05/07/17 165 lb (74.8 kg)  04/14/16 158 lb (71.7 kg)    Physical Exam Vitals signs and nursing note reviewed.  Constitutional:      General: She is not in acute distress.    Appearance: She is well-developed. She is not diaphoretic.  Eyes:     General: Scleral icterus (Left eye only) present.        Right eye: No discharge.        Left eye: No discharge.     Extraocular Movements: Extraocular movements intact.     Conjunctiva/sclera:     Left eye: Hemorrhage (Left conjunctival hemorrhage on the underside of the eye and slightly into the left side as well.  Appears to be resolving and has some yellowing.) present.     Pupils: Pupils are equal, round, and reactive  to light.  Cardiovascular:     Rate and Rhythm: Normal rate and regular rhythm.     Heart sounds: Normal heart sounds. No murmur.  Pulmonary:     Effort: Pulmonary effort is normal. No respiratory distress.     Breath sounds: Normal breath sounds. No wheezing.  Musculoskeletal: Normal range of motion.        General: No tenderness.  Skin:    General: Skin is warm and dry.     Findings: No rash.  Neurological:     Mental Status: She is alert and oriented to person, place, and time.     Coordination: Coordination normal.  Psychiatric:        Behavior: Behavior normal.         Assessment & Plan:   Problem List Items Addressed This Visit      Other   Lupus (systemic lupus erythematosus) ([place]) - Primary   Relevant Orders   CBC with Differential/Platelet   CMP14+EGFR   Lipid panel   Sedimentation rate   High sensitivity CRP   Systemic Lupus Profile B   Obesity (BMI 30.0-34.9)   Relevant Medications   Naltrexone-buPROPion HCl ER (CONTRAVE) 8-90 MG TB12   Naltrexone-buPROPion HCl ER (CONTRAVE) 8-90 MG TB12    Other Visit Diagnoses    Conjunctival hemorrhage of left eye       Resolving, was not related to any headaches or blood pressure being elevated, she says it arose while she was sleeping      Discussed weight loss counseling and obesity and dietary and lifestyle modifications with patient.  She would also like to try medication could help some with cravings, we will try to see if Minette [place] is covered. Follow up plan: Return in about 4 weeks (around 04/21/2018), or if symptoms worsen or fail to improve, for Weight recheck.  Counseling provided for all of the vaccine components Orders Placed This Encounter  Procedures  . CBC with Differential/Platelet  . CMP14+EGFR  . Lipid panel  . Sedimentation rate  . High sensitivity CRP  . Systemic Lupus Profile B    Caryl Pina, MD [facility] 03/24/2018, 9:20 AM

## 2018-03-25 ENCOUNTER — Telehealth: Payer: Self-pay | Admitting: Family Medicine

## 2018-03-25 ENCOUNTER — Telehealth: Payer: Self-pay

## 2018-03-25 DIAGNOSIS — E669 Obesity, unspecified: Secondary | ICD-10-CM

## 2018-03-25 LAB — SYSTEMIC LUPUS PROFILE B
ANA: POSITIVE — AB
COMPLEMENT C3, SERUM: 154 mg/dL (ref 82–167)
Chromatin Ab SerPl-aCnc: <0.2 AI (ref 0.0–0.9)
Complement C4, Serum: 23 mg/dL (ref 14–44)
dsDNA Ab: <1 IU/mL (ref 0–9)

## 2018-03-25 LAB — CMP14+EGFR
ALBUMIN: 4.2 g/dL (ref 3.5–5.5)
ALT: 13 IU/L (ref 0–32)
AST: 16 IU/L (ref 0–40)
Albumin/Globulin Ratio: 1.4 (ref 1.2–2.2)
Alkaline Phosphatase: 87 IU/L (ref 39–117)
BUN / CREAT RATIO: 15 (ref 9–23)
BUN: 11 mg/dL (ref 6–24)
Bilirubin Total: 0.3 mg/dL (ref 0.0–1.2)
CALCIUM: 9.2 mg/dL (ref 8.7–10.2)
CO2: 23 mmol/L (ref 20–29)
CREATININE: 0.73 mg/dL (ref 0.57–1.00)
Chloride: 101 mmol/L (ref 96–106)
GFR calc Af Amer: 117 mL/min/{1.73_m2} (ref >59)
GFR, EST NON AFRICAN AMERICAN: 101 mL/min/{1.73_m2} (ref >59)
GLUCOSE: 92 mg/dL (ref 65–99)
Globulin, Total: 2.9 g/dL (ref 1.5–4.5)
Potassium: 4.7 mmol/L (ref 3.5–5.2)
Sodium: 138 mmol/L (ref 134–144)
TOTAL PROTEIN: 7.1 g/dL (ref 6.0–8.5)

## 2018-03-25 LAB — LIPID PANEL
CHOL/HDL RATIO: 4.2 ratio (ref 0.0–4.4)
Cholesterol, Total: 178 mg/dL (ref 100–199)
HDL: 42 mg/dL (ref >39)
LDL CALC: 95 mg/dL (ref 0–99)
TRIGLYCERIDES: 207 mg/dL — AB (ref 0–149)
VLDL Cholesterol Cal: 41 mg/dL — ABNORMAL HIGH (ref 5–40)

## 2018-03-25 LAB — CBC WITH DIFFERENTIAL/PLATELET
BASOS: 1 %
Basophils Absolute: 0 10*3/uL (ref 0.0–0.2)
EOS (ABSOLUTE): 0.1 10*3/uL (ref 0.0–0.4)
Eos: 1 %
HEMATOCRIT: 37.9 % (ref 34.0–46.6)
Hemoglobin: 12.5 g/dL (ref 11.1–15.9)
Immature Grans (Abs): 0 10*3/uL (ref 0.0–0.1)
Immature Granulocytes: 0 %
Lymphocytes Absolute: 2.5 10*3/uL (ref 0.7–3.1)
Lymphs: 29 %
MCH: 27.8 pg (ref 26.6–33.0)
MCHC: 33 g/dL (ref 31.5–35.7)
MCV: 84 fL (ref 79–97)
MONOS ABS: 0.6 10*3/uL (ref 0.1–0.9)
Monocytes: 7 %
NEUTROS ABS: 5.3 10*3/uL (ref 1.4–7.0)
Neutrophils: 62 %
Platelets: 396 10*3/uL (ref 150–450)
RBC: 4.5 x10E6/uL (ref 3.77–5.28)
RDW: 12.7 % (ref 11.7–15.4)
WBC: 8.5 10*3/uL (ref 3.4–10.8)

## 2018-03-25 LAB — SEDIMENTATION RATE: SED RATE: 27 mm/h (ref 0–32)

## 2018-03-25 LAB — HIGH SENSITIVITY CRP: CRP HIGH SENSITIVITY: 9 mg/L — AB (ref 0.00–3.00)

## 2018-03-25 MED ORDER — LIRAGLUTIDE -WEIGHT MANAGEMENT 18 MG/3ML ~~LOC~~ SOPN: PEN_INJECTOR | SUBCUTANEOUS | 3 refills | Status: DC

## 2018-03-25 MED ORDER — INSULIN PEN NEEDLE 31G X 5 MM MISC: 1.0000 | Freq: Every day | 11 refills | Status: DC

## 2018-03-25 NOTE — Telephone Encounter (Signed)
[facility] called stating patient's contrave is not covered by insurance. Please send in something else.

## 2018-03-25 NOTE — Telephone Encounter (Signed)
Pt aware.

## 2018-03-25 NOTE — Addendum Note (Signed)
Addended by: Caryl Pina on: 03/25/2018 12:50 PM   Modules accepted: Orders

## 2018-03-25 NOTE — Telephone Encounter (Signed)
Please let patient know that I sent in Meghan Foster for which is the injection, she can try this first.

## 2018-03-25 NOTE — Telephone Encounter (Signed)
Pt is rtn call about labs

## 2018-03-28 ENCOUNTER — Telehealth: Payer: Self-pay | Admitting: Family Medicine

## 2018-03-28 NOTE — Telephone Encounter (Signed)
Patient aware of lab results and verbalizes understanding.

## 2018-03-31 NOTE — Telephone Encounter (Signed)
Patient aware of labs on 03/28/18

## 2018-04-05 ENCOUNTER — Telehealth: Payer: Self-pay

## 2018-04-05 NOTE — Telephone Encounter (Signed)
Insurance denied Meghan Foster   (patient has initiated an appeal)

## 2018-04-13 ENCOUNTER — Other Ambulatory Visit (HOSPITAL_COMMUNITY)
Admission: RE | Admit: 2018-04-13 | Discharge: 2018-04-13 | Disposition: A | Discharge status: Home / Self Care | Payer: BLUE CROSS/BLUE SHIELD | Source: Ambulatory Visit | Attending: Obstetrics and Gynecology | Admitting: Obstetrics and Gynecology

## 2018-04-13 ENCOUNTER — Ambulatory Visit (INDEPENDENT_AMBULATORY_CARE_PROVIDER_SITE_OTHER): Payer: BLUE CROSS/BLUE SHIELD | Admitting: Obstetrics and Gynecology

## 2018-04-13 ENCOUNTER — Encounter: Payer: Self-pay | Admitting: Obstetrics and Gynecology

## 2018-04-13 VITALS — BP 123/84 | HR 82 | Ht 62.0 in | Wt 165.7 lb

## 2018-04-13 DIAGNOSIS — Z1239 Encounter for other screening for malignant neoplasm of breast: Secondary | ICD-10-CM | POA: Diagnosis not present

## 2018-04-13 DIAGNOSIS — A630 Anogenital (venereal) warts: Secondary | ICD-10-CM | POA: Diagnosis not present

## 2018-04-13 DIAGNOSIS — Z01419 Encounter for gynecological examination (general) (routine) without abnormal findings: Secondary | ICD-10-CM

## 2018-04-13 DIAGNOSIS — Z975 Presence of (intrauterine) contraceptive device: Secondary | ICD-10-CM

## 2018-04-13 DIAGNOSIS — Z8742 Personal history of other diseases of the female genital tract: Secondary | ICD-10-CM | POA: Insufficient documentation

## 2018-04-13 MED ORDER — IMIQUIMOD 5 % EX CREA: TOPICAL_CREAM | CUTANEOUS | 5 refills | Status: DC

## 2018-04-13 NOTE — Progress Notes (Signed)
GYNECOLOGY ANNUAL PHYSICAL EXAM PROGRESS NOTE  Subjective:    Meghan Foster is a 43 y.o. G32P2012 female who presents for an annual exam. The patient has no complaints today. The patient is sexually active. The patient wears seatbelts: yes. The patient participates in regular exercise: no. Has the patient ever been transfused or tattooed?: no. The patient reports that there is not domestic violence in her life.    Gynecologic History Menarche age: 48 No LMP recorded. (Menstrual status: IUD).  Notes occasional spotting.   Contraception: IUD History of STI's: Denies Last Pap: 2/62018. Results were: abnormal, NILM but HR HPV+.  Denies prior h/o abnormal pap smears. Last mammogram: 08/30/2017. Results were: normal BIRADS 1.   followed by normal left breast diagnostic exam.    OB History  Gravida Para Term Preterm AB Living  3 2 2  0 1 2  SAB TAB Ectopic Multiple Live Births  0 1 0 0 2    # Outcome Date GA Lbr Len/2nd Weight Sex Delivery Anes PTL Lv  3 TAB 2015          2 Term 2007       N LIV  1 Term 2002     Vag-Spont  N LIV    Obstetric Comments  G2 - with autism    Past Medical History:  Diagnosis Date  . Anemia    iron deficiency  . Chronic kidney disease    [facility]  . History of genital warts   . IUD (intrauterine device) in place   . Lupus ([place])   . 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 RENAL BIOPSY Left December 2012   [facility]    Family History  Problem Relation Age of Onset  . Diabetes Mother   . Breast cancer Paternal Aunt     Social History   Socioeconomic History  . Marital status: Legally Separated    Spouse name: Not on file  . Number of children: Not on file  . Years of education: Not on file  . Highest education level: Not on file  Occupational History  . Not on file  Social Needs  . Financial resource strain: Not on file  . Food insecurity:    Worry:  Not on file    Inability: Not on file  . Transportation needs:    Medical: Not on file    Non-medical: Not on file  Tobacco Use  . Smoking status: Never Smoker  . Smokeless tobacco: Never Used  Substance and Sexual Activity  . Alcohol use: No    Alcohol/week: 0.0 standard drinks  . Drug use: No  . Sexual activity: Yes    Birth control/protection: I.U.D.  Lifestyle  . Physical activity:    Days per week: Not on file    Minutes per session: Not on file  . Stress: Not on file  Relationships  . Social connections:    Talks on phone: Not on file    Gets together: Not on file    Attends religious service: Not on file    Active member of club or organization: Not on file    Attends meetings of clubs or organizations: Not on file    Relationship status: Not on file  . Intimate partner violence:    Fear of current or ex partner: Not on file    Emotionally abused: Not on file  Physically abused: Not on file    Forced sexual activity: Not on file  Other Topics Concern  . Not on file  Social History Narrative  . Not on file    Current Outpatient Medications on File Prior to Visit  Medication Sig Dispense Refill  . Insulin Pen Needle (B-D UF III MINI PEN NEEDLES) 31G X 5 MM MISC 1 each by Does not apply route daily. 30 each 11  . Liraglutide -Weight Management (SAXENDA) 18 MG/3ML SOPN Inject 0.6 mg into the skin daily at 3 pm for 7 days, THEN 1.2 mg daily at 3 pm for 7 days, THEN 1.8 mg daily at 3 pm for 7 days, THEN 2.4 mg daily at 3 pm for 7 days, THEN 3 mg daily at 3 pm for 30 days. 4 pen 3  . Naltrexone-buPROPion HCl ER (CONTRAVE) 8-90 MG TB12 1 tablet Po Qam x7d ; then 1 tab po BID x 7 d; then 2 tab in AM and 1 in PM X7d; Then 2 po BID from then on 70 tablet 0  . Naltrexone-buPROPion HCl ER (CONTRAVE) 8-90 MG TB12 Take 2 tablets by mouth 2 (two) times daily. 120 tablet 2   No current facility-administered medications on file prior to visit.     No Known Allergies   Review  of Systems Constitutional: negative for chills, fatigue, fevers and sweats Eyes: negative for irritation, redness and visual disturbance Ears, nose, mouth, throat, and face: negative for hearing loss, nasal congestion, snoring and tinnitus Respiratory: negative for asthma, cough, sputum Cardiovascular: negative for chest pain, dyspnea, exertional chest pressure/discomfort, irregular heart beat, palpitations and syncope Gastrointestinal: negative for abdominal pain, change in bowel habits, nausea and vomiting Genitourinary: negative for abnormal menstrual periods, genital lesions, sexual problems and vaginal discharge, dysuria and urinary incontinence Integument/breast: negative for breast lump, breast tenderness and nipple discharge Hematologic/lymphatic: negative for bleeding and easy bruising Musculoskeletal:negative for back pain and muscle weakness Neurological: negative for dizziness, headaches, vertigo and weakness Endocrine: negative for diabetic symptoms including polydipsia, polyuria and skin dryness Allergic/Immunologic: negative for hay fever and urticaria       Objective:  Blood pressure 123/84, pulse 82, height 5' 2"  (1.575 m), weight 165 lb 11.2 oz (75.2 kg). Body mass index is 30.31 kg/m.  General Appearance:    Alert, cooperative, no distress, appears stated age  Head:    Normocephalic, without obvious abnormality, atraumatic  Eyes:    PERRL, conjunctiva/corneas clear, EOM's intact, both eyes  Ears:    Normal external ear canals, both ears  Nose:   Nares normal, septum midline, mucosa normal, no drainage or sinus tenderness  Throat:   Lips, mucosa, and tongue normal; teeth and gums normal  Neck:   Supple, symmetrical, trachea midline, no adenopathy; thyroid: no enlargement/tenderness/nodules; no carotid bruit or JVD  Back:     Symmetric, no curvature, ROM normal, no CVA tenderness  Lungs:     Clear to auscultation bilaterally, respirations unlabored  Chest Wall:    No  tenderness or deformity   Heart:    Regular rate and rhythm, S1 and S2 normal, no murmur, rub or gallop  Breast Exam:    No tenderness, masses, or nipple abnormality  Abdomen:     Soft, non-tender, bowel sounds active all four quadrants, no masses, no organomegaly.    Genitalia:    Pelvic:external genitalia normal, vagina without lesions, discharge, or tenderness, rectovaginal septum  normal. Cervix normal in appearance, no cervical motion tenderness, no adnexal masses or  tenderness. IUD threads not easily visualized.  Tip of strings visible just inside cervical canal.  Attempted to sweep strings with cytobrush successful.  Uterus normal size, shape, mobile, regular contours, nontender.  Rectal:    Normal external sphincter.  No hemorrhoids appreciated. Internal exam not done.   Extremities:   Extremities normal, atraumatic, no cyanosis or edema  Pulses:   2+ and symmetric all extremities  Skin:   Skin color, texture, turgor normal, no rashes or lesions  Lymph nodes:   Cervical, supraclavicular, and axillary nodes normal  Neurologic:   CNII-XII intact, normal strength, sensation and reflexes throughout   .  Labs:  Lab Results  Component Value Date   WBC 8.5 03/24/2018   HGB 12.5 03/24/2018   HCT 37.9 03/24/2018   MCV 84 03/24/2018   PLT 396 03/24/2018    Lab Results  Component Value Date   CREATININE 0.73 03/24/2018   BUN 11 03/24/2018   NA 138 03/24/2018   K 4.7 03/24/2018   CL 101 03/24/2018   CO2 23 03/24/2018    Lab Results  Component Value Date   ALT 13 03/24/2018   AST 16 03/24/2018   ALKPHOS 87 03/24/2018   BILITOT 0.3 03/24/2018    Lab Results  Component Value Date   TSH 1.220 08/22/2014     Assessment:   Healthy female exam.  Cervical cancer screening Breast cancer screening H/o Genital warts History of abnormal pap smear  Plan:     Blood tests: Labs performed by PCP.  Breast self exam technique reviewed and patient encouraged to perform self-exam  monthly. Contraception: Mirena IUD Discussed healthy lifestyle modifications. Mammogram ordered. Pap smear performed today for h/o abnormal pap smear (HPV+).     Genital warts. Patient treated with TCA in the past, but notes it did not completely resolve.  Discussed re-treatment with TCA,or can prescribe Aldara cream. Patient prefers cream. Will prescribe.  Declines flu vaccine.  Follow up in 1 year.     Rubie Maid, MD [facility]

## 2018-04-13 NOTE — Progress Notes (Signed)
PT is present today for her annual exam. Pt stated that she has not been doing self-breast exams monthly. Pt stated that she is doing well and denies any issues. No problems or concerns.

## 2018-04-13 NOTE — Patient Instructions (Addendum)
Imiquimod skin cream What is this medicine? IMIQUIMOD (i mi KWI mod) cream is used to treat external genital or anal warts. It is also used to treat other skin conditions such as actinic keratosis and certain types of skin cancer. This medicine may be used for other purposes; ask your health care provider or pharmacist if you have questions. COMMON BRAND NAME(S): Tawni Levy What should I tell my health care provider before I take this medicine? They need to know if you have any of these conditions: -decreased immune function -an unusual or allergic reaction to imiquimod, other medicines, foods, dyes, or preservatives -pregnant or trying to get pregnant -breast-feeding How should I use this medicine? This medicine is for external use only. Do not take by mouth. Follow the directions on the prescription label. Apply just before bedtime. Wash your hands before and after use. Apply a thin layer of cream and massage gently into the affected areas until no longer visible. Do not use in the mouth, eyes or the vagina. Use this medicine only on the affected area as directed by your health care provider. Do not use for longer than prescribed. It is important not to use more medicine than prescribed. To do so may increase the chance of side effects. Talk to your pediatrician regarding the use of this medicine in children. While this drug may be prescribed for children as young as 92 years of age for selected conditions, precautions do apply. Overdosage: If you think you have taken too much of this medicine contact a poison control center or emergency room at once. NOTE: This medicine is only for you. Do not share this medicine with others. What if I miss a dose? If you miss a dose, use it as soon as you can. If it is almost time for your next dose, use only that dose. Do not use double or extra doses. What may interact with this medicine? Interactions are not expected. Do not use any other medicines on  the treated area without asking your doctor or health care professional. This list may not describe all possible interactions. Give your health care provider a list of all the medicines, herbs, non-prescription drugs, or dietary supplements you use. Also tell them if you smoke, drink alcohol, or use illegal drugs. Some items may interact with your medicine. What should I watch for while using this medicine? Visit your health care professional for regular checks on your progress. Do not use this medicine until the skin has healed from any other drug (example: podofilox or podophyllin resin) or surgical skin treatment. Females should receive regular pelvic exams while being treated for genital warts. Most patients see improvement within 4 weeks. It may take up to 16 weeks to see a full clearing of the warts. This medicine is not a cure. New warts may develop during or after treatment. Avoid sexual (genital, anal, oral) contact while the cream is on the skin. If warts are visible in the genital area, sexual contact should be avoided until the warts are treated. The use of latex condoms during sexual contact may reduce, but not entirely prevent, infecting others. This medicine may weaken condoms, diaphragms, cervical caps or other barrier devices and make them less effective as birth control. Do not cover the treated area with an airtight bandage. Cotton gauze dressings can be used. Cotton underwear can be worn after using this medicine on the genital or anal area. Actinic keratoses that were not seen before may appear during treatment and  may later go away. The treatment area and surrounding area may lighten or darken after treatment with this medicine. These skin color changes may be permanent in some patients. If you experience a skin reaction at the treatment site that interferes or prevents you from doing any daily activity, contact your health care provider. You may need a rest period from treatment.  Treatment may be restarted once the reaction has gotten better as recommended by your doctor or health care professional. This medicine can make you more sensitive to the sun. Keep out of the sun. If you cannot avoid being in the sun, wear protective clothing and use sunscreen. Do not use sun lamps or tanning beds/booths. What side effects may I notice from receiving this medicine? Side effects that you should report to your doctor or health care professional as soon as possible: -open sores with or without drainage -skin infection -skin rash -unusual or severe skin reaction Side effects that usually do not require medical attention (report to your doctor or health care professional if they continue or are bothersome): -burning or itching -redness of the skin (very common but is usually not painful or harmful) -scabbing, crusting, or peeling skin -skin that becomes hard or thickened -swelling of the skin This list may not describe all possible side effects. Call your doctor for medical advice about side effects. You may report side effects to FDA at 1-800-FDA-1088. Where should I keep my medicine? Keep out of the reach of children. Store between 4 and 25 degrees C (39 and 77 degrees F). Do not freeze. Throw away any unused medicine after the expiration date. Discard packet after applying to affected area. Partial packets should not be saved or reused. NOTE: This sheet is a summary. It may not cover all possible information. If you have questions about this medicine, talk to your doctor, pharmacist, or health care provider.  2019 Elsevier/Gold Standard (2008-02-07 10:33:25)   Health Maintenance, Female Adopting a healthy lifestyle and getting preventive care can go a long way to promote health and wellness. Talk with your health care provider about what schedule of regular examinations is right for you. This is a good chance for you to check in with your provider about disease prevention and  staying healthy. In between checkups, there are plenty of things you can do on your own. Experts have done a lot of research about which lifestyle changes and preventive measures are most likely to keep you healthy. Ask your health care provider for more information. Weight and diet Eat a healthy diet  Be sure to include plenty of vegetables, fruits, low-fat dairy products, and lean protein.  Do not eat a lot of foods high in solid fats, added sugars, or salt.  Get regular exercise. This is one of the most important things you can do for your health. ? Most adults should exercise for at least 150 minutes each week. The exercise should increase your heart rate and make you sweat (moderate-intensity exercise). ? Most adults should also do strengthening exercises at least twice a week. This is in addition to the moderate-intensity exercise. Maintain a healthy weight  Body mass index (BMI) is a measurement that can be used to identify possible weight problems. It estimates body fat based on height and weight. Your health care provider can help determine your BMI and help you achieve or maintain a healthy weight.  For females 56 years of age and older: ? A BMI below 18.5 is considered underweight. ? A  BMI of 18.5 to 24.9 is normal. ? A BMI of 25 to 29.9 is considered overweight. ? A BMI of 30 and above is considered obese. Watch levels of cholesterol and blood lipids  You should start having your blood tested for lipids and cholesterol at 44 years of age, then have this test every 5 years.  You may need to have your cholesterol levels checked more often if: ? Your lipid or cholesterol levels are high. ? You are older than 44 years of age. ? You are at high risk for heart disease. Cancer screening Lung Cancer  Lung cancer screening is recommended for adults 46-16 years old who are at high risk for lung cancer because of a history of smoking.  A yearly low-dose CT scan of the lungs is  recommended for people who: ? Currently smoke. ? Have quit within the past 15 years. ? Have at least a 30-pack-year history of smoking. A pack year is smoking an average of one pack of cigarettes a day for 1 year.  Yearly screening should continue until it has been 15 years since you quit.  Yearly screening should stop if you develop a health problem that would prevent you from having lung cancer treatment. Breast Cancer  Practice breast self-awareness. This means understanding how your breasts normally appear and feel.  It also means doing regular breast self-exams. Let your health care provider know about any changes, no matter how small.  If you are in your 20s or 30s, you should have a clinical breast exam (CBE) by a health care provider every 1-3 years as part of a regular health exam.  If you are 59 or older, have a CBE every year. Also consider having a breast X-ray (mammogram) every year.  If you have a family history of breast cancer, talk to your health care provider about genetic screening.  If you are at high risk for breast cancer, talk to your health care provider about having an MRI and a mammogram every year.  Breast cancer gene (BRCA) assessment is recommended for women who have family members with BRCA-related cancers. BRCA-related cancers include: ? Breast. ? Ovarian. ? Tubal. ? Peritoneal cancers.  Results of the assessment will determine the need for genetic counseling and BRCA1 and BRCA2 testing. Cervical Cancer Your health care provider may recommend that you be screened regularly for cancer of the pelvic organs (ovaries, uterus, and vagina). This screening involves a pelvic examination, including checking for microscopic changes to the surface of your cervix (Pap test). You may be encouraged to have this screening done every 3 years, beginning at age 1.  For women ages 39-65, health care providers may recommend pelvic exams and Pap testing every 3 years, or  they may recommend the Pap and pelvic exam, combined with testing for human papilloma virus (HPV), every 5 years. Some types of HPV increase your risk of cervical cancer. Testing for HPV may also be done on women of any age with unclear Pap test results.  Other health care providers may not recommend any screening for nonpregnant women who are considered low risk for pelvic cancer and who do not have symptoms. Ask your health care provider if a screening pelvic exam is right for you.  If you have had past treatment for cervical cancer or a condition that could lead to cancer, you need Pap tests and screening for cancer for at least 20 years after your treatment. If Pap tests have been discontinued, your risk factors (  such as having a new sexual partner) need to be reassessed to determine if screening should resume. Some women have medical problems that increase the chance of getting cervical cancer. In these cases, your health care provider may recommend more frequent screening and Pap tests. Colorectal Cancer  This type of cancer can be detected and often prevented.  Routine colorectal cancer screening usually begins at 44 years of age and continues through 44 years of age.  Your health care provider may recommend screening at an earlier age if you have risk factors for colon cancer.  Your health care provider may also recommend using home test kits to check for hidden blood in the stool.  A small camera at the end of a tube can be used to examine your colon directly (sigmoidoscopy or colonoscopy). This is done to check for the earliest forms of colorectal cancer.  Routine screening usually begins at age 28.  Direct examination of the colon should be repeated every 5-10 years through 44 years of age. However, you may need to be screened more often if early forms of precancerous polyps or small growths are found. Skin Cancer  Check your skin from head to toe regularly.  Tell your health care  provider about any new moles or changes in moles, especially if there is a change in a mole's shape or color.  Also tell your health care provider if you have a mole that is larger than the size of a pencil eraser.  Always use sunscreen. Apply sunscreen liberally and repeatedly throughout the day.  Protect yourself by wearing long sleeves, pants, a wide-brimmed hat, and sunglasses whenever you are outside. Heart disease, diabetes, and high blood pressure  High blood pressure causes heart disease and increases the risk of stroke. High blood pressure is more likely to develop in: ? People who have blood pressure in the high end of the normal range (130-139/85-89 mm Hg). ? People who are overweight or obese. ? People who are African American.  If you are 74-47 years of age, have your blood pressure checked every 3-5 years. If you are 54 years of age or older, have your blood pressure checked every year. You should have your blood pressure measured twice-once when you are at a hospital or clinic, and once when you are not at a hospital or clinic. Record the average of the two measurements. To check your blood pressure when you are not at a hospital or clinic, you can use: ? An automated blood pressure machine at a pharmacy. ? A home blood pressure monitor.  If you are between 47 years and 26 years old, ask your health care provider if you should take aspirin to prevent strokes.  Have regular diabetes screenings. This involves taking a blood sample to check your fasting blood sugar level. ? If you are at a normal weight and have a low risk for diabetes, have this test once every three years after 44 years of age. ? If you are overweight and have a high risk for diabetes, consider being tested at a younger age or more often. Preventing infection Hepatitis B  If you have a higher risk for hepatitis B, you should be screened for this virus. You are considered at high risk for hepatitis B if: ? You  were born in a country where hepatitis B is common. Ask your health care provider which countries are considered high risk. ? Your parents were born in a high-risk country, and you have not  been immunized against hepatitis B (hepatitis B vaccine). ? You have HIV or AIDS. ? You use needles to inject street drugs. ? You live with someone who has hepatitis B. ? You have had sex with someone who has hepatitis B. ? You get hemodialysis treatment. ? You take certain medicines for conditions, including cancer, organ transplantation, and autoimmune conditions. Hepatitis C  Blood testing is recommended for: ? Everyone born from [birth year] through 1966. ? Anyone with known risk factors for hepatitis C. Sexually transmitted infections (STIs)  You should be screened for sexually transmitted infections (STIs) including gonorrhea and chlamydia if: ? You are sexually active and are younger than 44 years of age. ? You are older than 44 years of age and your health care provider tells you that you are at risk for this type of infection. ? Your sexual activity has changed since you were last screened and you are at an increased risk for chlamydia or gonorrhea. Ask your health care provider if you are at risk.  If you do not have HIV, but are at risk, it may be recommended that you take a prescription medicine daily to prevent HIV infection. This is called pre-exposure prophylaxis (PrEP). You are considered at risk if: ? You are sexually active and do not regularly use condoms or know the HIV status of your partner(s). ? You take drugs by injection. ? You are sexually active with a partner who has HIV. Talk with your health care provider about whether you are at high risk of being infected with HIV. If you choose to begin PrEP, you should first be tested for HIV. You should then be tested every 3 months for as long as you are taking PrEP. Pregnancy  If you are premenopausal and you may become pregnant, ask your  health care provider about preconception counseling.  If you may become pregnant, take 400 to 800 micrograms (mcg) of folic acid every day.  If you want to prevent pregnancy, talk to your health care provider about birth control (contraception). Osteoporosis and menopause  Osteoporosis is a disease in which the bones lose minerals and strength with aging. This can result in serious bone fractures. Your risk for osteoporosis can be identified using a bone density scan.  If you are 54 years of age or older, or if you are at risk for osteoporosis and fractures, ask your health care provider if you should be screened.  Ask your health care provider whether you should take a calcium or vitamin D supplement to lower your risk for osteoporosis.  Menopause may have certain physical symptoms and risks.  Hormone replacement therapy may reduce some of these symptoms and risks. Talk to your health care provider about whether hormone replacement therapy is right for you. Follow these instructions at home:  Schedule regular health, dental, and eye exams.  Stay current with your immunizations.  Do not use any tobacco products including cigarettes, chewing tobacco, or electronic cigarettes.  If you are pregnant, do not drink alcohol.  If you are breastfeeding, limit how much and how often you drink alcohol.  Limit alcohol intake to no more than 1 drink per day for nonpregnant women. One drink equals 12 ounces of beer, 5 ounces of wine, or 1 ounces of hard liquor.  Do not use street drugs.  Do not share needles.  Ask your health care provider for help if you need support or information about quitting drugs.  Tell your health care provider if you often  feel depressed.  Tell your health care provider if you have ever been abused or do not feel safe at home. This information is not intended to replace advice given to you by your health care provider. Make sure you discuss any questions you have  with your health care provider. Document Released: 09/08/2010 Document Revised: 08/01/2015 Document Reviewed: 11/27/2014 Elsevier Interactive Patient Education  2019 [place] Breast self-awareness means:  Knowing how your breasts look.  Knowing how your breasts feel.  Checking your breasts every month for changes.  Telling your doctor if you notice a change in your breasts. Breast self-awareness allows you to notice a breast problem early while it is still small. How to do a breast self-exam One way to learn what is normal for your breasts and to check for changes is to do a breast self-exam. To do a breast self-exam: Look for Changes  1. Take off all the clothes above your waist. 2. Stand in front of a mirror in a room with good lighting. 3. Put your hands on your hips. 4. Push your hands down. 5. Look at your breasts and nipples in the mirror to see if one breast or nipple looks different than the other. Check to see if: ? The shape of one breast is different. ? The size of one breast is different. ? There are wrinkles, dips, and bumps in one breast and not the other. 6. Look at each breast for changes in your skin, such as: ? Redness. ? Scaly areas. 7. Look for changes in your nipples, such as: ? Liquid around the nipples. ? Bleeding. ? Dimpling. ? Redness. ? A change in where the nipples are. Feel for Changes 1. Lie on your back on the floor. 2. Feel each breast. To do this, follow these steps: ? Pick a breast to feel. ? Put the arm closest to that breast above your head. ? Use your other arm to feel the nipple area of your breast. Feel the area with the pads of your three middle fingers by making small circles with your fingers. For the first circle, press lightly. For the second circle, press harder. For the third circle, press even harder. ? Keep making circles with your fingers at the light, harder, and even harder pressures as you move  down your breast. Stop when you feel your ribs. ? Move your fingers a little toward the center of your body. ? Start making circles with your fingers again, this time going up until you reach your collarbone. ? Keep making up and down circles until you reach your armpit. Remember to keep using the three pressures. ? Feel the other breast in the same way. 3. Sit or stand in the shower or tub. 4. With soapy water on your skin, feel each breast the same way you did in step 2, when you were lying on the floor. Write Down What You Find After doing the self-exam, write down:  What is normal for each breast.  Any changes you find in each breast.  When you last had your period.  How often should I check my breasts? Check your breasts every month. If you are breastfeeding, the best time to check them is after you feed your baby or after you use a breast pump. If you get periods, the best time to check your breasts is 5-7 days after your period is over. When should I see my doctor? See your doctor if you notice:  A change in shape or size of your breasts or nipples.  A change in the skin of your breast or nipples, such as red or scaly skin.  Unusual fluid coming from your nipples.  A lump or thick area that was not there before.  Pain in your breasts.  Anything that concerns you. This information is not intended to replace advice given to you by your health care provider. Make sure you discuss any questions you have with your health care provider. Document Released: 08/12/2007 Document Revised: 08/01/2015 Document Reviewed: 01/13/2015 Elsevier Interactive Patient Education  2019 [employer].

## 2018-04-18 LAB — CYTOLOGY - PAP
Diagnosis: NEGATIVE
HPV: NOT DETECTED

## 2018-04-20 ENCOUNTER — Telehealth: Payer: Self-pay

## 2018-04-20 NOTE — Telephone Encounter (Signed)
Can we give her a call to see if she still wants the Sax[place].  If she does she needs to get that form signed to [place].

## 2018-04-20 NOTE — Telephone Encounter (Signed)
Patient never returned signed consent to do an appeal on her [employer] so we can not proceed

## 2018-04-22 NOTE — Telephone Encounter (Signed)
No answer

## 2018-04-25 ENCOUNTER — Ambulatory Visit: Payer: BLUE CROSS/BLUE SHIELD | Admitting: Family Medicine

## 2018-04-26 ENCOUNTER — Encounter: Payer: Self-pay | Admitting: Family Medicine

## 2018-04-26 DIAGNOSIS — R319 Hematuria, unspecified: Secondary | ICD-10-CM | POA: Diagnosis not present

## 2018-04-26 DIAGNOSIS — I10 Essential (primary) hypertension: Secondary | ICD-10-CM | POA: Diagnosis not present

## 2018-04-26 DIAGNOSIS — R809 Proteinuria, unspecified: Secondary | ICD-10-CM | POA: Diagnosis not present

## 2018-04-26 DIAGNOSIS — M3214 Glomerular disease in systemic lupus erythematosus: Secondary | ICD-10-CM | POA: Diagnosis not present

## 2018-04-29 NOTE — Telephone Encounter (Signed)
Multiple attempts made to contact patient. This encounter will now be closed

## 2019-03-08 ENCOUNTER — Other Ambulatory Visit: Payer: Self-pay

## 2019-03-08 ENCOUNTER — Ambulatory Visit: Payer: BC Managed Care – PPO | Attending: Internal Medicine

## 2019-03-08 DIAGNOSIS — Z20822 Contact with and (suspected) exposure to covid-19: Secondary | ICD-10-CM

## 2019-03-08 DIAGNOSIS — Z20828 Contact with and (suspected) exposure to other viral communicable diseases: Secondary | ICD-10-CM | POA: Insufficient documentation

## 2019-03-09 LAB — NOVEL CORONAVIRUS, NAA: SARS-CoV-2, NAA: NOT DETECTED

## 2019-03-14 ENCOUNTER — Other Ambulatory Visit (HOSPITAL_COMMUNITY): Payer: Self-pay | Admitting: Obstetrics and Gynecology

## 2019-03-14 DIAGNOSIS — Z1231 Encounter for screening mammogram for malignant neoplasm of breast: Secondary | ICD-10-CM

## 2019-03-15 ENCOUNTER — Encounter (HOSPITAL_COMMUNITY): Payer: Self-pay

## 2019-03-21 ENCOUNTER — Other Ambulatory Visit: Payer: Self-pay

## 2019-03-21 ENCOUNTER — Encounter: Payer: Self-pay | Admitting: Family

## 2019-03-21 ENCOUNTER — Ambulatory Visit (INDEPENDENT_AMBULATORY_CARE_PROVIDER_SITE_OTHER): Payer: BLUE CROSS/BLUE SHIELD | Admitting: Family

## 2019-03-21 DIAGNOSIS — R42 Dizziness and giddiness: Secondary | ICD-10-CM | POA: Diagnosis not present

## 2019-03-21 DIAGNOSIS — J301 Allergic rhinitis due to pollen: Secondary | ICD-10-CM

## 2019-03-21 MED ORDER — FLUTICASONE PROPIONATE 50 MCG/ACT NA SUSP: 2.0000 | Freq: Every day | NASAL | 6 refills | Status: DC

## 2019-03-21 MED ORDER — CETIRIZINE HCL 10 MG PO TABS: 10.0000 mg | ORAL_TABLET | Freq: Every day | ORAL | 11 refills | Status: DC

## 2019-03-21 MED ORDER — MECLIZINE HCL 25 MG PO TABS: 25.0000 mg | ORAL_TABLET | Freq: Three times a day (TID) | ORAL | 0 refills | Status: DC | PRN

## 2019-03-21 NOTE — Progress Notes (Signed)
Virtual Visit via telephone Note Due to COVID-19 pandemic this visit was conducted virtually. This visit type was conducted due to national recommendations for restrictions regarding the COVID-19 Pandemic (e.g. social distancing, sheltering in place) in an effort to limit this patient's exposure and mitigate transmission in our community. All issues noted in this document were discussed and addressed.  A physical exam was not performed with this format.  I connected with Meghan Foster on 03/21/19 at 2:13 pm  by telephone and verified that I am speaking with the correct person using two identifiers. Meghan Foster is currently located in her car and  No one  is currently with her  during visit. The provider, Evelina Dun, FNP is located in their office at time of visit.  I discussed the limitations, risks, security and privacy concerns of performing an evaluation and management service by telephone and the availability of in person appointments. I also discussed with the patient that there may be a patient responsible charge related to this service. The patient expressed understanding and agreed to proceed.   History and Present Illness:  Pt calls the office today with intermittent dizziness. She states she was tested for COVID last week and it was negative.  Dizziness This is a new problem. The current episode started in the past 7 days. The problem occurs intermittently. The problem has been unchanged. Associated symptoms include congestion, fatigue and nausea. Pertinent negatives include no chills, coughing, diaphoresis, fever, headaches, joint swelling, sore throat, urinary symptoms or vomiting. The symptoms are aggravated by bending and stress. She has tried rest for the symptoms. The treatment provided mild relief.      Review of Systems  Constitutional: Positive for fatigue. Negative for chills, diaphoresis and fever.  HENT: Positive for congestion. Negative for sore throat.    Respiratory: Negative for cough.   Gastrointestinal: Positive for nausea. Negative for vomiting.  Musculoskeletal: Negative for joint swelling.  Neurological: Positive for dizziness. Negative for headaches.  All other systems reviewed and are negative.    Observations/Objective: No SOB or distress noted  Assessment and Plan: 1. Dizziness Fall precautions discussed Start zyrtec and flonase to see if this helps Antivert as needed Avoid fast position changes Labs pending RTO if symptoms worsen or do not improve - meclizine (ANTIVERT) 25 MG tablet; Take 1 tablet (25 mg total) by mouth 3 (three) times daily as needed for dizziness.  Dispense: 30 tablet; Refill: 0 - CBC with Differential/Platelet; Future - BMP8+EGFR; Future  2. Allergic rhinitis due to pollen, unspecified seasonality - cetirizine (ZYRTEC) 10 MG tablet; Take 1 tablet (10 mg total) by mouth daily.  Dispense: 30 tablet; Refill: 11 - fluticasone (FLONASE) 50 MCG/ACT nasal spray; Place 2 sprays into both nostrils daily.  Dispense: 16 g; Refill: 6 - CBC with Differential/Platelet; Future - BMP8+EGFR; Future    I discussed the assessment and treatment plan with the patient. The patient was provided an opportunity to ask questions and all were answered. The patient agreed with the plan and demonstrated an understanding of the instructions.   The patient was advised to call back or seek an in-person evaluation if the symptoms worsen or if the condition fails to improve as anticipated.  The above assessment and management plan was discussed with the patient. The patient verbalized understanding of and has agreed to the management plan. Patient is aware to call the clinic if symptoms persist or worsen. Patient is aware when to return to the clinic for a follow-up visit. Patient  educated on when it is appropriate to go to the emergency department.   Time call ended:  2:26 pm   I provided 13 minutes of non-face-to-face time  during this encounter.    Evelina Dun, FNP

## 2019-03-24 ENCOUNTER — Other Ambulatory Visit: Payer: BLUE CROSS/BLUE SHIELD

## 2019-03-24 ENCOUNTER — Other Ambulatory Visit: Payer: Self-pay

## 2019-03-24 DIAGNOSIS — J301 Allergic rhinitis due to pollen: Secondary | ICD-10-CM

## 2019-03-24 DIAGNOSIS — R42 Dizziness and giddiness: Secondary | ICD-10-CM

## 2019-03-25 LAB — BMP8+EGFR
BUN/Creatinine Ratio: 22 (ref 9–23)
BUN: 16 mg/dL (ref 6–24)
CO2: 22 mmol/L (ref 20–29)
Calcium: 8.7 mg/dL (ref 8.7–10.2)
Chloride: 103 mmol/L (ref 96–106)
Creatinine, Ser: 0.73 mg/dL (ref 0.57–1.00)
GFR calc Af Amer: 116 mL/min/{1.73_m2} (ref >59)
GFR calc non Af Amer: 100 mL/min/{1.73_m2} (ref >59)
Glucose: 82 mg/dL (ref 65–99)
Potassium: 4 mmol/L (ref 3.5–5.2)
Sodium: 137 mmol/L (ref 134–144)

## 2019-03-25 LAB — CBC WITH DIFFERENTIAL/PLATELET
Basophils Absolute: 0.1 10*3/uL (ref 0.0–0.2)
Basos: 1 %
EOS (ABSOLUTE): 0.1 10*3/uL (ref 0.0–0.4)
Eos: 1 %
Hematocrit: 39.4 % (ref 34.0–46.6)
Hemoglobin: 12.6 g/dL (ref 11.1–15.9)
Immature Grans (Abs): 0 10*3/uL (ref 0.0–0.1)
Immature Granulocytes: 0 %
Lymphocytes Absolute: 3.7 10*3/uL — ABNORMAL HIGH (ref 0.7–3.1)
Lymphs: 35 %
MCH: 27.5 pg (ref 26.6–33.0)
MCHC: 32 g/dL (ref 31.5–35.7)
MCV: 86 fL (ref 79–97)
Monocytes Absolute: 0.7 10*3/uL (ref 0.1–0.9)
Monocytes: 7 %
Neutrophils Absolute: 6.1 10*3/uL (ref 1.4–7.0)
Neutrophils: 56 %
Platelets: 398 10*3/uL (ref 150–450)
RBC: 4.58 x10E6/uL (ref 3.77–5.28)
RDW: 13 % (ref 11.7–15.4)
WBC: 10.7 10*3/uL (ref 3.4–10.8)

## 2019-04-06 ENCOUNTER — Other Ambulatory Visit: Payer: Self-pay

## 2019-04-06 ENCOUNTER — Ambulatory Visit (HOSPITAL_COMMUNITY)
Admission: RE | Admit: 2019-04-06 | Discharge: 2019-04-06 | Disposition: A | Discharge status: Home / Self Care | Payer: BC Managed Care – PPO | Source: Ambulatory Visit | Attending: Obstetrics and Gynecology | Admitting: Obstetrics and Gynecology

## 2019-04-06 DIAGNOSIS — Z1231 Encounter for screening mammogram for malignant neoplasm of breast: Secondary | ICD-10-CM | POA: Diagnosis not present

## 2019-04-18 ENCOUNTER — Ambulatory Visit (INDEPENDENT_AMBULATORY_CARE_PROVIDER_SITE_OTHER): Payer: BC Managed Care – PPO | Admitting: Obstetrics and Gynecology

## 2019-04-18 ENCOUNTER — Other Ambulatory Visit: Payer: Self-pay

## 2019-04-18 ENCOUNTER — Encounter: Payer: Self-pay | Admitting: Obstetrics and Gynecology

## 2019-04-18 VITALS — BP 138/82 | HR 99 | Ht 62.0 in | Wt 162.0 lb

## 2019-04-18 DIAGNOSIS — Z01419 Encounter for gynecological examination (general) (routine) without abnormal findings: Secondary | ICD-10-CM

## 2019-04-18 DIAGNOSIS — E663 Overweight: Secondary | ICD-10-CM

## 2019-04-18 DIAGNOSIS — Z30433 Encounter for removal and reinsertion of intrauterine contraceptive device: Secondary | ICD-10-CM | POA: Diagnosis not present

## 2019-04-18 DIAGNOSIS — Z8742 Personal history of other diseases of the female genital tract: Secondary | ICD-10-CM

## 2019-04-18 DIAGNOSIS — Z1231 Encounter for screening mammogram for malignant neoplasm of breast: Secondary | ICD-10-CM

## 2019-04-18 NOTE — Progress Notes (Signed)
Pt present for annual exam. Pt declined flu vaccine. Pt stated that she was doing well no problems.

## 2019-04-18 NOTE — Progress Notes (Signed)
GYNECOLOGY ANNUAL PHYSICAL EXAM PROGRESS NOTE  Subjective:    Meghan Foster is a 44 y.o. G50P2012 female who presents for an annual exam. The patient has no complaints today. The patient is sexually active. The patient wears seatbelts: yes. Does the patient participates in regular exercise?: no. Has the patient ever been transfused or tattooed?: no. The patient reports that there is not domestic violence in her life.    Gynecologic History Menarche age: 89 No LMP recorded. (Menstrual status: IUD).   Contraception: Mirena IUD, has had in place for 5 years.  History of STI's: Denies Last Pap: 04/13/2018. Results were: normal.  H/o abnormal pap smear in 2018, NILM but HR HPV+.   Last mammogram: 04/06/2019. Results were: normal BIRADS 1.      OB History  Gravida Para Term Preterm AB Living  3 2 2  0 1 2  SAB TAB Ectopic Multiple Live Births  0 1 0 0 2    # Outcome Date GA Lbr Len/2nd Weight Sex Delivery Anes PTL Lv  3 TAB 2015          2 Term 2007       N LIV  1 Term 2002     Vag-Spont  N LIV    Obstetric Comments  G2 - with autism    Past Medical History:  Diagnosis Date  . Anemia    iron deficiency  . Chronic kidney disease    [facility]  . History of genital warts   . IUD (intrauterine device) in place   . 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 RENAL BIOPSY Left December 2012   [facility]  . WISDOM TOOTH EXTRACTION      Family History  Problem Relation Age of Onset  . Diabetes Mother   . Healthy Father   . Healthy Sister   . Healthy Brother   . Breast cancer Paternal Aunt     Social History   Socioeconomic History  . Marital status: Legally Separated    Spouse name: Not on file  . Number of children: Not on file  . Years of education: Not on file  . Highest education level: Not on file  Occupational History  . Not on file  Tobacco Use  . Smoking status:  Never Smoker  . Smokeless tobacco: Never Used  Substance and Sexual Activity  . Alcohol use: No    Alcohol/week: 0.0 standard drinks  . Drug use: No  . Sexual activity: Yes    Birth control/protection: I.U.D.  Other Topics Concern  . Not on file  Social History Narrative  . Not on file   Social Determinants of Health   Financial Resource Strain:   . Difficulty of Paying Living Expenses: Not on file  Food Insecurity:   . Worried About Charity fundraiser in the Last Year: Not on file  . Ran Out of Food in the Last Year: Not on file  Transportation Needs:   . Lack of Transportation (Medical): Not on file  . Lack of Transportation (Non-Medical): Not on file  Physical Activity:   . Days of Exercise per Week: Not on file  . Minutes of Exercise per Session: Not on file  Stress:   . Feeling of Stress : Not on file  Social Connections:   . Frequency of Communication with Friends and Family: Not on  file  . Frequency of Social Gatherings with Friends and Family: Not on file  . Attends Religious Services: Not on file  . Active Member of Clubs or Organizations: Not on file  . Attends Archivist Meetings: Not on file  . Marital Status: Not on file  Intimate Partner Violence:   . Fear of Current or Ex-Partner: Not on file  . Emotionally Abused: Not on file  . Physically Abused: Not on file  . Sexually Abused: Not on file    Current Outpatient Medications on File Prior to Visit  Medication Sig Dispense Refill  . cetirizine (ZYRTEC) 10 MG tablet Take 1 tablet (10 mg total) by mouth daily. 30 tablet 11  . fluticasone (FLONASE) 50 MCG/ACT nasal spray Place 2 sprays into both nostrils daily. 16 g 6  . meclizine (ANTIVERT) 25 MG tablet Take 1 tablet (25 mg total) by mouth 3 (three) times daily as needed for dizziness. 30 tablet 0  . imiquimod (ALDARA) 5 % cream Apply topically 3 (three) times a week. Apply until total clearance or maximum of 16 weeks (Patient not taking: Reported  on 04/18/2019) 24 each 5   No current facility-administered medications on file prior to visit.    No Known Allergies   Review of Systems Constitutional: negative for chills, fatigue, fevers and sweats Eyes: negative for irritation, redness and visual disturbance Ears, nose, mouth, throat, and face: negative for hearing loss, nasal congestion, snoring and tinnitus Respiratory: negative for asthma, cough, sputum Cardiovascular: negative for chest pain, dyspnea, exertional chest pressure/discomfort, irregular heart beat, palpitations and syncope Gastrointestinal: negative for abdominal pain, change in bowel habits, nausea and vomiting Genitourinary: negative for abnormal menstrual periods, genital lesions, sexual problems and vaginal discharge, dysuria and urinary incontinence Integument/breast: negative for breast lump, breast tenderness and nipple discharge Hematologic/lymphatic: negative for bleeding and easy bruising Musculoskeletal:negative for back pain and muscle weakness Neurological: negative for dizziness, headaches, vertigo and weakness Endocrine: negative for diabetic symptoms including polydipsia, polyuria and skin dryness Allergic/Immunologic: negative for hay fever and urticaria       Objective:  Blood pressure 138/82, pulse 99, height 5' 2"  (1.575 m), weight 162 lb (73.5 kg). Body mass index is 29.63 kg/m.  General Appearance:    Alert, cooperative, no distress, appears stated age, overweight.   Head:    Normocephalic, without obvious abnormality, atraumatic  Eyes:    PERRL, conjunctiva/corneas clear, EOM's intact, both eyes  Ears:    Normal external ear canals, both ears  Nose:   Nares normal, septum midline, mucosa normal, no drainage or sinus tenderness  Throat:   Lips, mucosa, and tongue normal; teeth and gums normal  Neck:   Supple, symmetrical, trachea midline, no adenopathy; thyroid: no enlargement/tenderness/nodules; no carotid bruit or JVD  Back:     Symmetric,  no curvature, ROM normal, no CVA tenderness  Lungs:     Clear to auscultation bilaterally, respirations unlabored  Chest Wall:    No tenderness or deformity   Heart:    Regular rate and rhythm, S1 and S2 normal, no murmur, rub or gallop  Breast Exam:    No tenderness, masses, or nipple abnormality  Abdomen:     Soft, non-tender, bowel sounds active all four quadrants, no masses, no organomegaly.    Genitalia:    Pelvic:external genitalia normal, vagina without lesions, discharge, or tenderness, rectovaginal septum  normal. Cervix normal in appearance, no cervical motion tenderness, no adnexal masses or tenderness. IUD threads not easily visualized.  Tip  of strings visible just inside cervical canal.  Attempted to sweep strings with cytobrush successful.  Uterus normal size, shape, mobile, regular contours, nontender.  Rectal:    Normal external sphincter.  No hemorrhoids appreciated. Internal exam not done.   Extremities:   Extremities normal, atraumatic, no cyanosis or edema  Pulses:   2+ and symmetric all extremities  Skin:   Skin color, texture, turgor normal, no rashes or lesions  Lymph nodes:   Cervical, supraclavicular, and axillary nodes normal  Neurologic:   CNII-XII intact, normal strength, sensation and reflexes throughout   .  Labs:  Lab Results  Component Value Date   WBC 10.7 03/24/2019   HGB 12.6 03/24/2019   HCT 39.4 03/24/2019   MCV 86 03/24/2019   PLT 398 03/24/2019    Lab Results  Component Value Date   CREATININE 0.73 03/24/2019   BUN 16 03/24/2019   NA 137 03/24/2019   K 4.0 03/24/2019   CL 103 03/24/2019   CO2 22 03/24/2019    Lab Results  Component Value Date   ALT 13 03/24/2018   AST 16 03/24/2018   ALKPHOS 87 03/24/2018   BILITOT 0.3 03/24/2018    Lab Results  Component Value Date   TSH 1.220 08/22/2014     Assessment:   Healthy female exam.  Cervical cancer screening Breast cancer screening History of abnormal pap  smear Overweight  Plan:     Blood tests: Labs performed by PCP.  Breast self exam technique reviewed and patient encouraged to perform self-exam monthly. Contraception: Mirena IUD due for removal. Patient desires removal with reinsertion.  Will place Liletta to allow for longer stent of use (up to 7 years). See procedure note below.  Discussed healthy lifestyle modifications. Mammogram up to date. Pap smear up to date.  Past history of mild pap abnormalities with most recent pap smear normal. Can continue routine screening.   Declines flu vaccine.  Follow up in 1 year.      GYNECOLOGY OFFICE PROCEDURE NOTE  Meghan Foster is a 44 y.o. [id number] here for Mirena IUD removal and Liletta insertion. No GYN concerns.  Last pap smear was on 04/2018 and was normal.  IUD Removal and Reinsertion  Patient identified, informed consent performed, consent signed.   Discussed risks of irregular bleeding, cramping, infection, malpositioning or misplacement of the IUD outside the uterus which may require further procedures. Also discussed >99% contraception efficacy, increased risk of ectopic pregnancy with failure of method.  Advised to use backup contraception for one week as the risk of pregnancy is higher during the transition period of removing an IUD and replacing it with another one. Time out was performed. Speculum placed in the vagina. The strings of the IUD were grasped and pulled using ring forceps. The IUD was successfully removed in its entirety. The cervix was cleaned with Betadine x 2 and grasped anteriorly with a single tooth tenaculum.  The new Liletta IUD insertion apparatus was used to sound the uterus to 7 cm;  the IUD was then placed per manufacturer's recommendations. Strings trimmed to 3 cm. Tenaculum was removed, good hemostasis noted. Patient tolerated procedure well.   Patient was given post-procedure instructions.  She was reminded to have backup contraception for one week during  this transition period between IUDs.  Patient was also asked to check IUD strings periodically and follow up in 4 weeks for IUD check.  Lot#: [id number] Exp: 03/2022   Rubie Maid, MD [facility]

## 2019-04-18 NOTE — Patient Instructions (Signed)
Health Maintenance, Female Adopting a healthy lifestyle and getting preventive care are important in promoting health and wellness. Ask your health care provider about:  The right schedule for you to have regular tests and exams.  Things you can do on your own to prevent diseases and keep yourself healthy. What should I know about diet, weight, and exercise? Eat a healthy diet   Eat a diet that includes plenty of vegetables, fruits, low-fat dairy products, and lean protein.  Do not eat a lot of foods that are high in solid fats, added sugars, or sodium. Maintain a healthy weight Body mass index (BMI) is used to identify weight problems. It estimates body fat based on height and weight. Your health care provider can help determine your BMI and help you achieve or maintain a healthy weight. Get regular exercise Get regular exercise. This is one of the most important things you can do for your health. Most adults should:  Exercise for at least 150 minutes each week. The exercise should increase your heart rate and make you sweat (moderate-intensity exercise).  Do strengthening exercises at least twice a week. This is in addition to the moderate-intensity exercise.  Spend less time sitting. Even light physical activity can be beneficial. Watch cholesterol and blood lipids Have your blood tested for lipids and cholesterol at 45 years of age, then have this test every 5 years. Have your cholesterol levels checked more often if:  Your lipid or cholesterol levels are high.  You are older than 45 years of age.  You are at high risk for heart disease. What should I know about cancer screening? Depending on your health history and family history, you may need to have cancer screening at various ages. This may include screening for:  Breast cancer.  Cervical cancer.  Colorectal cancer.  Skin cancer.  Lung cancer. What should I know about heart disease, diabetes, and high blood  pressure? Blood pressure and heart disease  High blood pressure causes heart disease and increases the risk of stroke. This is more likely to develop in people who have high blood pressure readings, are of African descent, or are overweight.  Have your blood pressure checked: ? Every 3-5 years if you are 18-39 years of age. ? Every year if you are 40 years old or older. Diabetes Have regular diabetes screenings. This checks your fasting blood sugar level. Have the screening done:  Once every three years after age 40 if you are at a normal weight and have a low risk for diabetes.  More often and at a younger age if you are overweight or have a high risk for diabetes. What should I know about preventing infection? Hepatitis B If you have a higher risk for hepatitis B, you should be screened for this virus. Talk with your health care provider to find out if you are at risk for hepatitis B infection. Hepatitis C Testing is recommended for:  Everyone born from 1945 through 1965.  Anyone with known risk factors for hepatitis C. Sexually transmitted infections (STIs)  Get screened for STIs, including gonorrhea and chlamydia, if: ? You are sexually active and are younger than 45 years of age. ? You are older than 45 years of age and your health care provider tells you that you are at risk for this type of infection. ? Your sexual activity has changed since you were last screened, and you are at increased risk for chlamydia or gonorrhea. Ask your health care provider if   you are at risk.  Ask your health care provider about whether you are at high risk for HIV. Your health care provider may recommend a prescription medicine to help prevent HIV infection. If you choose to take medicine to prevent HIV, you should first get tested for HIV. You should then be tested every 3 months for as long as you are taking the medicine. Pregnancy  If you are about to stop having your period (premenopausal) and  you may become pregnant, seek counseling before you get pregnant.  Take 400 to 800 micrograms (mcg) of folic acid every day if you become pregnant.  Ask for birth control (contraception) if you want to prevent pregnancy. Osteoporosis and menopause Osteoporosis is a disease in which the bones lose minerals and strength with aging. This can result in bone fractures. If you are 84 years old or older, or if you are at risk for osteoporosis and fractures, ask your health care provider if you should:  Be screened for bone loss.  Take a calcium or vitamin D supplement to lower your risk of fractures.  Be given hormone replacement therapy (HRT) to treat symptoms of menopause. Follow these instructions at home: Lifestyle  Do not use any products that contain nicotine or tobacco, such as cigarettes, e-cigarettes, and chewing tobacco. If you need help quitting, ask your health care provider.  Do not use street drugs.  Do not share needles.  Ask your health care provider for help if you need support or information about quitting drugs. Alcohol use  Do not drink alcohol if: ? Your health care provider tells you not to drink. ? You are pregnant, may be pregnant, or are planning to become pregnant.  If you drink alcohol: ? Limit how much you use to 0-1 drink a day. ? Limit intake if you are breastfeeding.  Be aware of how much alcohol is in your drink. In the [place] one drink equals one 12 oz bottle of beer (355 mL), one 5 oz glass of wine (148 mL), or one 1 oz glass of hard liquor (44 mL). General instructions  Schedule regular health, dental, and eye exams.  Stay current with your vaccines.  Tell your health care provider if: ? You often feel depressed. ? You have ever been abused or do not feel safe at home. Summary  Adopting a healthy lifestyle and getting preventive care are important in promoting health and wellness.  Follow your health care provider's instructions about healthy  diet, exercising, and getting tested or screened for diseases.  Follow your health care provider's instructions on monitoring your cholesterol and blood pressure. This information is not intended to replace advice given to you by your health care provider. Make sure you discuss any questions you have with your health care provider. Document Revised: 02/16/2018 Document Reviewed: 02/16/2018 Elsevier Patient Education  2020 [place] Breast self-awareness means being familiar with how your breasts look and feel. It involves checking your breasts regularly and reporting any changes to your health care provider. Practicing breast self-awareness is important. Sometimes changes may not be harmful (are benign), but sometimes a change in your breasts can be a sign of a serious medical problem. It is important to learn how to do this procedure correctly so that you can catch problems early, when treatment is more likely to be successful. All women should practice breast self-awareness, including women who have had breast implants. What you need:  A mirror.  A  well-lit room. How to do a breast self-exam A breast self-exam is one way to learn what is normal for your breasts and whether your breasts are changing. To do a breast self-exam: Look for changes  1. Remove all the clothing above your waist. 2. Stand in front of a mirror in a room with good lighting. 3. Put your hands on your hips. 4. Push your hands firmly downward. 5. Compare your breasts in the mirror. Look for differences between them (asymmetry), such as: ? Differences in shape. ? Differences in size. ? Puckers, dips, and bumps in one breast and not the other. 6. Look at each breast for changes in the skin, such as: ? Redness. ? Scaly areas. 7. Look for changes in your nipples, such as: ? Discharge. ? Bleeding. ? Dimpling. ? Redness. ? A change in position. Feel for changes Carefully feel your  breasts for lumps and changes. It is best to do this while lying on your back on the floor, and again while sitting or standing in the tub or shower with soapy water on your skin. Feel each breast in the following way: 1. Place the arm on the side of the breast you are examining above your head. 2. Feel your breast with the other hand. 3. Start in the nipple area and make -inch (2 cm) overlapping circles to feel your breast. Use the pads of your three middle fingers to do this. Apply light pressure, then medium pressure, then firm pressure. The light pressure will allow you to feel the tissue closest to the skin. The medium pressure will allow you to feel the tissue that is a little deeper. The firm pressure will allow you to feel the tissue close to the ribs. 4. Continue the overlapping circles, moving downward over the breast until you feel your ribs below your breast. 5. Move one finger-width toward the center of the body. Continue to use the -inch (2 cm) overlapping circles to feel your breast as you move slowly up toward your collarbone. 6. Continue the up-and-down exam using all three pressures until you reach your armpit.  Write down what you find Writing down what you find can help you remember what to discuss with your health care provider. Write down:  What is normal for each breast.  Any changes that you find in each breast, including: ? The kind of changes you find. ? Any pain or tenderness. ? Size and location of any lumps.  Where you are in your menstrual cycle, if you are still menstruating. General tips and recommendations  Examine your breasts every month.  If you are breastfeeding, the best time to examine your breasts is after a feeding or after using a breast pump.  If you menstruate, the best time to examine your breasts is 5-7 days after your period. Breasts are generally lumpier during menstrual periods, and it may be more difficult to notice changes.  With time  and practice, you will become more familiar with the variations in your breasts and more comfortable with the exam. Contact a health care provider if you:  See a change in the shape or size of your breasts or nipples.  See a change in the skin of your breast or nipples, such as a reddened or scaly area.  Have unusual discharge from your nipples.  Find a lump or thick area that was not there before.  Have pain in your breasts.  Have any concerns related to your breast health. Summary  Breast self-awareness includes looking for physical changes in your breasts, as well as feeling for any changes within your breasts.  Breast self-awareness should be performed in front of a mirror in a well-lit room.  You should examine your breasts every month. If you menstruate, the best time to examine your breasts is 5-7 days after your menstrual period.  Let your health care provider know of any changes you notice in your breasts, including changes in size, changes on the skin, pain or tenderness, or unusual fluid from your nipples. This information is not intended to replace advice given to you by your health care provider. Make sure you discuss any questions you have with your health care provider. Document Revised: 10/12/2017 Document Reviewed: 10/12/2017 Elsevier Patient Education  [place].  Intrauterine Device Insertion, Care After  This sheet gives you information about how to care for yourself after your procedure. Your health care provider may also give you more specific instructions. If you have problems or questions, contact your health care provider. What can I expect after the procedure? After the procedure, it is common to have:  Cramps and pain in the abdomen.  Light bleeding (spotting) or heavier bleeding that is like your menstrual period. This may last for up to a few days.  Lower back pain.  Dizziness.  Headaches.  Nausea. Follow these instructions at  home:  Before resuming sexual activity, check to make sure that you can feel the IUD string(s). You should be able to feel the end of the string(s) below the opening of your cervix. If your IUD string is in place, you may resume sexual activity. ? If you had a hormonal IUD inserted more than 7 days after your most recent period started, you will need to use a backup method of birth control for 7 days after IUD insertion. Ask your health care provider whether this applies to you.  Continue to check that the IUD is still in place by feeling for the string(s) after every menstrual period, or once a month.  Take over-the-counter and prescription medicines only as told by your health care provider.  Do not drive or use heavy machinery while taking prescription pain medicine.  Keep all follow-up visits as told by your health care provider. This is important. Contact a health care provider if:  You have bleeding that is heavier or lasts longer than a normal menstrual cycle.  You have a fever.  You have cramps or abdominal pain that get worse or do not get better with medicine.  You develop abdominal pain that is new or is not in the same area of earlier cramping and pain.  You feel lightheaded or weak.  You have abnormal or bad-smelling discharge from your vagina.  You have pain during sexual activity.  You have any of the following problems with your IUD string(s): ? The string bothers or hurts you or your sexual partner. ? You cannot feel the string. ? The string has gotten longer.  You can feel the IUD in your vagina.  You think you may be pregnant, or you miss your menstrual period.  You think you may have an STI (sexually transmitted infection). Get help right away if:  You have flu-like symptoms.  You have a fever and chills.  You can feel that your IUD has slipped out of place. Summary  After the procedure, it is common to have cramps and pain in the abdomen. It is also  common to have light  bleeding (spotting) or heavier bleeding that is like your menstrual period.  Continue to check that the IUD is still in place by feeling for the string(s) after every menstrual period, or once a month.  Keep all follow-up visits as told by your health care provider. This is important.  Contact your health care provider if you have problems with your IUD string(s), such as the string getting longer or bothering you or your sexual partner. This information is not intended to replace advice given to you by your health care provider. Make sure you discuss any questions you have with your health care provider. Document Revised: 02/05/2017 Document Reviewed: 01/15/2016 Elsevier Patient Education  2020 [employer].

## 2019-04-19 ENCOUNTER — Encounter: Payer: Self-pay | Admitting: Obstetrics and Gynecology

## 2019-05-01 DIAGNOSIS — Z03818 Encounter for observation for suspected exposure to other biological agents ruled out: Secondary | ICD-10-CM | POA: Diagnosis not present

## 2019-05-01 DIAGNOSIS — Z20828 Contact with and (suspected) exposure to other viral communicable diseases: Secondary | ICD-10-CM | POA: Diagnosis not present

## 2019-05-17 ENCOUNTER — Encounter: Payer: BC Managed Care – PPO | Admitting: Obstetrics and Gynecology

## 2019-05-30 ENCOUNTER — Encounter: Payer: Self-pay | Admitting: Obstetrics and Gynecology

## 2019-09-05 ENCOUNTER — Encounter: Payer: Self-pay | Admitting: Family Medicine

## 2019-09-05 ENCOUNTER — Other Ambulatory Visit: Payer: Self-pay

## 2019-09-05 ENCOUNTER — Ambulatory Visit (INDEPENDENT_AMBULATORY_CARE_PROVIDER_SITE_OTHER): Payer: BC Managed Care – PPO | Admitting: Family Medicine

## 2019-09-05 VITALS — BP 127/94 | HR 92 | Temp 98.0°F | Ht 62.0 in | Wt 159.2 lb

## 2019-09-05 DIAGNOSIS — N6310 Unspecified lump in the right breast, unspecified quadrant: Secondary | ICD-10-CM | POA: Diagnosis not present

## 2019-09-05 NOTE — Progress Notes (Signed)
   Assessment & Plan:  1. Mass of right breast - MM DIAG BREAST TOMO UNI RIGHT; Future - US BREAST LTD UNI RIGHT INC AXILLA; Future   Follow up plan: Return if symptoms worsen or fail to improve.  Hendricks Limes, MSN, APRN, FNP-C [facility]  Subjective:   Patient ID: Meghan Foster, female    DOB: Jul 10, 1975, 44 y.o.   MRN: [id number]  HPI: Meghan Foster is a 44 y.o. female presenting on 09/05/2019 for lump on breast (right breast )  Patient reports she noticed a painless lump on her right breast five days ago. Denies any nipple discharge, dimpling, or other skin changes. She had a mammogram in January of this year that showed breast tissue is extremely dense. She does not do regular self breast exams but feels certain she would have noticed this sooner if it was there earlier. She also reports having a pap smear with breast exam 2-3 months ago and nothing was mentioned then.   ROS: Negative unless specifically indicated above in HPI.   Relevant past medical history reviewed and updated as indicated.   Allergies and medications reviewed and updated.  No current outpatient medications on file.  No Known Allergies  Objective:   BP (!) 127/94   Pulse 92   Temp 98 F (36.7 C) (Temporal)   Ht 5' 2"  (1.575 m)   Wt 159 lb 3.2 oz (72.2 kg)   SpO2 100%   BMI 29.12 kg/m    Physical Exam Vitals reviewed. Exam conducted with a chaperone present.  Constitutional:      General: She is not in acute distress.    Appearance: Normal appearance. She is overweight. She is not ill-appearing, toxic-appearing or diaphoretic.  HENT:     Head: Normocephalic and atraumatic.  Eyes:     General: No scleral icterus.       Right eye: No discharge.        Left eye: No discharge.     Conjunctiva/sclera: Conjunctivae normal.  Cardiovascular:     Rate and Rhythm: Normal rate.  Pulmonary:     Effort: Pulmonary effort is normal. No respiratory distress.  Chest:      Breasts: Breasts are symmetrical.        Right: Mass (10 o'clock) present. No swelling, bleeding, inverted nipple, nipple discharge, skin change or tenderness.        Left: Normal.  Musculoskeletal:        General: Normal range of motion.     Cervical back: Normal range of motion.  Lymphadenopathy:     Upper Body:     Right upper body: No supraclavicular, axillary or pectoral adenopathy.     Left upper body: No supraclavicular, axillary or pectoral adenopathy.  Skin:    General: Skin is warm and dry.     Capillary Refill: Capillary refill takes less than 2 seconds.  Neurological:     General: No focal deficit present.     Mental Status: She is alert and oriented to person, place, and time. Mental status is at baseline.  Psychiatric:        Mood and Affect: Mood normal.        Behavior: Behavior normal.        Thought Content: Thought content normal.        Judgment: Judgment normal.

## 2019-09-06 ENCOUNTER — Other Ambulatory Visit: Payer: Self-pay

## 2019-09-06 DIAGNOSIS — N6310 Unspecified lump in the right breast, unspecified quadrant: Secondary | ICD-10-CM

## 2019-09-07 ENCOUNTER — Telehealth: Payer: Self-pay | Admitting: Family Medicine

## 2019-09-07 NOTE — Telephone Encounter (Signed)
Appointment for Meghan Foster canceled - Spoke with [facility] and was advised they have 09/15/2019 open for appointment and to have patient call back to set up a time that work for her - patient notified

## 2019-09-07 NOTE — Telephone Encounter (Signed)
  REFERRAL REQUEST Telephone Note 09/07/2019  What type of referral do you need? Dx Mammo  Why do you need this referral? Pt had an apt here for lump on breast and needs DX mammo  Have you been seen at our office for this problem? yes (Advise that they may need an appointment with their PCP before a referral can be done)  Is there a particular doctor or location that you prefer? [facility] in [facility]--pt can get in sooner but needs a referral. July 9th.  Patient notified that referrals can take up to a week or longer to process. If they haven't heard anything within a week they should call back and speak with the referral department.

## 2019-09-15 ENCOUNTER — Ambulatory Visit
Admission: RE | Admit: 2019-09-15 | Discharge: 2019-09-15 | Disposition: A | Discharge status: Home / Self Care | Payer: BC Managed Care – PPO | Source: Ambulatory Visit | Attending: Family Medicine | Admitting: Family Medicine

## 2019-09-15 DIAGNOSIS — N6310 Unspecified lump in the right breast, unspecified quadrant: Secondary | ICD-10-CM

## 2019-09-15 DIAGNOSIS — R922 Inconclusive mammogram: Secondary | ICD-10-CM | POA: Diagnosis not present

## 2019-09-15 DIAGNOSIS — N6001 Solitary cyst of right breast: Secondary | ICD-10-CM | POA: Diagnosis not present

## 2019-09-21 ENCOUNTER — Other Ambulatory Visit (HOSPITAL_COMMUNITY): Payer: BC Managed Care – PPO

## 2019-09-21 ENCOUNTER — Encounter (HOSPITAL_COMMUNITY): Payer: BC Managed Care – PPO

## 2019-10-03 DIAGNOSIS — N6001 Solitary cyst of right breast: Secondary | ICD-10-CM | POA: Diagnosis not present

## 2019-10-03 LAB — HM MAMMOGRAPHY: HM Mammogram: NORMAL (ref 0–4)

## 2019-12-15 ENCOUNTER — Encounter: Payer: Self-pay | Admitting: Family Medicine

## 2019-12-15 ENCOUNTER — Ambulatory Visit (INDEPENDENT_AMBULATORY_CARE_PROVIDER_SITE_OTHER): Payer: BC Managed Care – PPO | Admitting: Family Medicine

## 2019-12-15 VITALS — BP 147/96 | HR 88 | Temp 97.7°F | Ht 62.0 in | Wt 157.0 lb

## 2019-12-15 DIAGNOSIS — R0981 Nasal congestion: Secondary | ICD-10-CM | POA: Diagnosis not present

## 2019-12-15 NOTE — Progress Notes (Signed)
Subjective: CC: URI PCP: Dettinger, Meghan Kaufmann, MD LOV:Meghan Foster is a 44 y.o. female presenting to clinic today for:  1. URI Patient reports a 1 day h/o rhinorrhea, congestion and fatigue.  No hemoptysis, shortness of breath, wheeze, chest pain, post tussive emesis, nausea, diarrhea, fever, chills, known sick contacts.  She works with the public however.  She has not been vaccinated against COVID and wanted to be checked just to make sure.  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 Social History   Socioeconomic History   Marital status: Divorced    Spouse name: Not on file   Number of children: Not on file   Years of education: Not on file   Highest education level: Not on file  Occupational History   Not on file  Tobacco Use   Smoking status: Never Smoker   Smokeless tobacco: Never Used  Vaping Use   Vaping Use: Never used  Substance and Sexual Activity   Alcohol use: No    Alcohol/week: 0.0 standard drinks   Drug use: No   Sexual activity: Yes    Birth control/protection: I.U.D.  Other Topics Concern   Not on file  Social History Narrative   Not on file   Social Determinants of Health   Financial Resource Strain:    Difficulty of Paying Living Expenses: Not on file  Food Insecurity:    Worried About Standard in the Last Year: Not on file   Ran Out of Food in the Last Year: Not on file  Transportation Needs:    Lack of Transportation (Medical): Not on file   Lack of Transportation (Non-Medical): Not on file  Physical Activity:    Days of Exercise per Week: Not on file   Minutes of Exercise per Session: Not on file  Stress:      Feeling of Stress : Not on file  Social Connections:    Frequency of Communication with Friends and Family: Not on file   Frequency of Social Gatherings with Friends and Family: Not on file   Attends Religious Services: Not on file   Active Member of Clubs or Organizations: Not on file   Attends Archivist Meetings: Not on file   Marital Status: Not on file  Intimate Partner Violence:    Fear of Current or Ex-Partner: Not on file   Emotionally Abused: Not on file   Physically Abused: Not on file   Sexually Abused: Not on file   Family History  Problem Relation Age of Onset   Diabetes Mother    Healthy Father    Healthy Sister    Healthy Brother    Breast cancer Paternal Aunt     Objective: Office vital signs reviewed. BP (!) 147/96    Pulse 88    Temp 97.7 F (36.5 C) (Temporal)    Ht 5' 2"  (1.575 m)    Wt 157 lb (71.2 kg)    SpO2 97%    BMI 28.72 kg/m   Physical Examination:  General: Awake, alert, well nourished, tired appearing. No acute distress HEENT: Normal; sclera white.  Cardio: regular rate and rhythm, S1S2 heard, no murmurs appreciated Pulm: clear to auscultation bilaterally, no wheezes, rhonchi or rales; normal work of breathing on room air Extremities: warm, well perfused, No edema, cyanosis or clubbing; +2 pulses bilaterally MSK: normal gait and station  Assessment/ Plan: 44 y.o. female   1. Nasal congestion We will check for COVID-19, home care instructions reviewed with patient.  Reasons for return discussed.  Push fluids.  Vitamin C and zinc.  Rest.  Okay to use Tylenol as needed. - Novel Coronavirus, NAA ([facility])   Orders Placed This Encounter  Procedures   Novel Coronavirus, NAA ([facility])    Order Specific Question:   Is this test for diagnosis or screening    Answer:   Diagnosis of ill patient    Order Specific Question:   Symptomatic for COVID-19 as defined by CDC    Answer:   Yes    Order Specific Question:   Date  of Symptom Onset    Answer:   12/14/2019    Order Specific Question:   Hospitalized for COVID-19    Answer:   No    Order Specific Question:   Admitted to ICU for COVID-19    Answer:   No    Order Specific Question:   Previously tested for COVID-19    Answer:   Yes    Order Specific Question:   Resident in a congregate (group) care setting    Answer:   No    Order Specific Question:   Is the patient student?    Answer:   No    Order Specific Question:   Employed in healthcare setting    Answer:   No    Order Specific Question:   Pregnant    Answer:   No    Order Specific Question:   Has patient completed COVID vaccination(s) (2 doses of Pfizer/Moderna 1 dose of [employer])    Answer:   No   No orders of the defined types were placed in this encounter.    Janora Norlander, DO [place] [phone]

## 2019-12-16 LAB — SARS-COV-2, NAA 2 DAY TAT

## 2019-12-16 LAB — NOVEL CORONAVIRUS, NAA: SARS-CoV-2, NAA: NOT DETECTED

## 2020-03-29 ENCOUNTER — Encounter: Payer: Self-pay | Admitting: Family Medicine

## 2020-03-29 ENCOUNTER — Ambulatory Visit (INDEPENDENT_AMBULATORY_CARE_PROVIDER_SITE_OTHER): Payer: BC Managed Care – PPO | Admitting: Family Medicine

## 2020-03-29 DIAGNOSIS — J029 Acute pharyngitis, unspecified: Secondary | ICD-10-CM

## 2020-03-29 DIAGNOSIS — R509 Fever, unspecified: Secondary | ICD-10-CM

## 2020-03-29 LAB — RAPID STREP SCREEN (MED CTR MEBANE ONLY): Strep Gp A Ag, IA W/Reflex: NEGATIVE

## 2020-03-29 LAB — CULTURE, GROUP A STREP

## 2020-03-29 NOTE — Progress Notes (Signed)
   Virtual Visit via telephone Note Due to COVID-19 pandemic this visit was conducted virtually. This visit type was conducted due to national recommendations for restrictions regarding the COVID-19 Pandemic (e.g. social distancing, sheltering in place) in an effort to limit this patient's exposure and mitigate transmission in our community. All issues noted in this document were discussed and addressed.  A physical exam was not performed with this format.  I connected with Jonn Shingles on 03/29/20 at 1408 by telephone and verified that I am speaking with the correct person using two identifiers. Samariah Hokenson is currently located at home and her child is currently with her during the visit. The provider, Gwenlyn Perking, FNP is located in their office at time of visit.  I discussed the limitations, risks, security and privacy concerns of performing an evaluation and management service by telephone and the availability of in person appointments. I also discussed with the patient that there may be a patient responsible charge related to this service. The patient expressed understanding and agreed to proceed.  CC: sore throat  History and Present Illness:  HPI  Tanina reports a sore throat x 2 days. It is painful to swallow. She also reports a fever of 100.7, chills, hoarseness, cough, and chest congestion. She has been doing warm water gargles, motrin, cough drops, a theraflu with some improvement. She denies vomiting, shortness of breath, or chest pain. She denies known exposure to Covid. She has not been vaccinated. She is able to keep fluids down.   ROS As per HPI.   Observations/Objective: Alert and oriented x 3. Able to speak in full sentences without difficulty. Hoarseness of voice noted.   Assessment and Plan: Alissia was seen today for sore throat.  Diagnoses and all orders for this visit:  Sore throat/Fever, unspecified fever cause Patient will come by office for testing  as below. Quarantine until [employer]. Tylenol for pain.  Can continue warm gargles, cough drops. Mucinex for congestion. Push fluids. Return to office for new or worsening symptoms, or if symptoms persist.  -     Novel Coronavirus, NAA ([facility]) -     Rapid Strep Screen ([facility] ONLY)  Follow Up Instructions: As needed    I discussed the assessment and treatment plan with the patient. The patient was provided an opportunity to ask questions and all were answered. The patient agreed with the plan and demonstrated an understanding of the instructions.   The patient was advised to call back or seek an in-person evaluation if the symptoms worsen or if the condition fails to improve as anticipated.  The above assessment and management plan was discussed with the patient. The patient verbalized understanding of and has agreed to the management plan. Patient is aware to call the clinic if symptoms persist or worsen. Patient is aware when to return to the clinic for a follow-up visit. Patient educated on when it is appropriate to go to the emergency department.   Time call ended: [place] Kha Hari, FNP

## 2020-03-31 LAB — NOVEL CORONAVIRUS, NAA: SARS-CoV-2, NAA: DETECTED — AB

## 2020-03-31 LAB — SARS-COV-2, NAA 2 DAY TAT

## 2020-04-01 ENCOUNTER — Telehealth: Payer: Self-pay | Admitting: *Deleted

## 2020-04-01 ENCOUNTER — Telehealth: Payer: Self-pay

## 2020-04-01 NOTE — Telephone Encounter (Signed)
Advised pt of quarantine for at least 5 days from start of symptoms. If symptoms are mild or absent at after day 5, may stop quarantine and wear a mask around others for 5 additional days. If unable to wear a mask or if symptoms have not improved, continue quarantine until 10 days.   Pt states she is continuing to have coughing fits every 6 hours when the mucinex wears off. Advised pt to quarantine a few more days till these symptoms are improving.

## 2020-04-01 NOTE — Telephone Encounter (Signed)
Called to discuss with patient about COVID-19 symptoms and the use of one of the available treatments for those with mild to moderate Covid symptoms and at a high risk of hospitalization.  Pt appears to qualify for outpatient treatment due to co-morbid conditions and/or a member of an at-risk group in accordance with the FDA Emergency Use Authorization.    Symptom onset:  Vaccinated:  Booster?  Immunocompromised?  Qualifiers:   Patient reports she is feeling much better and is on the mend and awaiting a call from her pcp.Does not feel any treatment is necessary at this time.  Tarry Kos

## 2020-04-02 ENCOUNTER — Telehealth: Payer: Self-pay

## 2020-04-18 ENCOUNTER — Encounter: Payer: BC Managed Care – PPO | Admitting: Obstetrics and Gynecology

## 2020-05-07 ENCOUNTER — Encounter: Payer: Self-pay | Admitting: Obstetrics and Gynecology

## 2020-05-08 ENCOUNTER — Encounter: Payer: BC Managed Care – PPO | Admitting: Obstetrics and Gynecology

## 2020-05-23 ENCOUNTER — Encounter: Payer: Self-pay | Admitting: Obstetrics and Gynecology

## 2020-06-04 ENCOUNTER — Ambulatory Visit (INDEPENDENT_AMBULATORY_CARE_PROVIDER_SITE_OTHER): Payer: BC Managed Care – PPO | Admitting: Obstetrics and Gynecology

## 2020-06-04 ENCOUNTER — Encounter: Payer: Self-pay | Admitting: Obstetrics and Gynecology

## 2020-06-04 ENCOUNTER — Other Ambulatory Visit: Payer: Self-pay

## 2020-06-04 VITALS — BP 139/97 | HR 105 | Ht 62.0 in | Wt 163.1 lb

## 2020-06-04 DIAGNOSIS — Z1231 Encounter for screening mammogram for malignant neoplasm of breast: Secondary | ICD-10-CM

## 2020-06-04 DIAGNOSIS — Z01419 Encounter for gynecological examination (general) (routine) without abnormal findings: Secondary | ICD-10-CM

## 2020-06-04 DIAGNOSIS — N760 Acute vaginitis: Secondary | ICD-10-CM

## 2020-06-04 DIAGNOSIS — Z1211 Encounter for screening for malignant neoplasm of colon: Secondary | ICD-10-CM | POA: Diagnosis not present

## 2020-06-04 DIAGNOSIS — E663 Overweight: Secondary | ICD-10-CM

## 2020-06-04 DIAGNOSIS — B9689 Other specified bacterial agents as the cause of diseases classified elsewhere: Secondary | ICD-10-CM

## 2020-06-04 MED ORDER — METRONIDAZOLE 0.75 % VA GEL: 1.0000 | Freq: Every day | VAGINAL | 1 refills | Status: DC

## 2020-06-04 NOTE — Progress Notes (Signed)
GYNECOLOGY ANNUAL PHYSICAL EXAM PROGRESS NOTE  Subjective:    Meghan Foster is a 45 y.o. G38P2012 female who presents for an annual exam. The patient is sexually active. The patient wears seatbelts: yes. Does the patient participates in regular exercise?: no. Has the patient ever been transfused or tattooed?: no. The patient reports that there is not domestic violence in her life.   The patient has the following complaints today.  1. Reports an odor with sex. Symptoms have been ongoing for ~ 1 month. Odor is "fishy" in nature.  Attempted to use OTC remedy for relief as it helped in the past, but symptoms have persisted.    Gynecologic History Menarche age: 30 No LMP recorded. (Menstrual status: IUD).   Contraception: Liletta IUD, has had in place for 1 year.  History of STI's: Denies Last Pap: 04/13/2018. Results were: normal.  H/o abnormal pap smear in 2018, NILM but HR HPV+.   Last mammogram: 04/06/2019. Results were: normal BIRADS 1.      OB History  Gravida Para Term Preterm AB Living  3 2 2  0 1 2  SAB IAB Ectopic Multiple Live Births  0 1 0 0 2    # Outcome Date GA Lbr Len/2nd Weight Sex Delivery Anes PTL Lv  3 IAB 2015          2 Term 2007       N LIV  1 Term 2002     Vag-Spont  N LIV    Obstetric Comments  G2 - with autism    Past Medical History:  Diagnosis Date  . Anemia    iron deficiency  . Chronic kidney disease    [facility]  . History of genital warts   . IUD (intrauterine device) in place   . Lupus ([place])   . 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 RENAL BIOPSY Left December 2012   [facility]  . WISDOM TOOTH EXTRACTION      Family History  Problem Relation Age of Onset  . Diabetes Mother   . Healthy Father   . Healthy Sister   . Healthy Brother   . Breast cancer Paternal Aunt     Social History   Socioeconomic History  . Marital status: Divorced    Spouse  name: Not on file  . Number of children: Not on file  . Years of education: Not on file  . Highest education level: Not on file  Occupational History  . Not on file  Tobacco Use  . Smoking status: Never Smoker  . Smokeless tobacco: Never Used  Vaping Use  . Vaping Use: Never used  Substance and Sexual Activity  . Alcohol use: No    Alcohol/week: 0.0 standard drinks  . Drug use: No  . Sexual activity: Yes    Birth control/protection: I.U.D.  Other Topics Concern  . Not on file  Social History Narrative  . Not on file   Social Determinants of Health   Financial Resource Strain: Not on file  Food Insecurity: Not on file  Transportation Needs: Not on file  Physical Activity: Not on file  Stress: Not on file  Social Connections: Not on file  Intimate Partner Violence: Not on file    Current Outpatient Medications on File Prior to Visit  Medication Sig Dispense Refill  . levonorgestrel (LILETTA, 52 MG,) 19.5 MCG/DAY IUD IUD 1  each by Intrauterine route once.     No current facility-administered medications on file prior to visit.    No Known Allergies   Review of Systems Constitutional: negative for chills, fatigue, fevers and sweats Eyes: negative for irritation, redness and visual disturbance Ears, nose, mouth, throat, and face: negative for hearing loss, nasal congestion, snoring and tinnitus Respiratory: negative for asthma, cough, sputum Cardiovascular: negative for chest pain, dyspnea, exertional chest pressure/discomfort, irregular heart beat, palpitations and syncope Gastrointestinal: negative for abdominal pain, change in bowel habits, nausea and vomiting Genitourinary: negative for abnormal menstrual periods, genital lesions, sexual problems and vaginal discharge, dysuria and urinary incontinence. Positive for vaginal odor with sex.  Integument/breast: negative for breast lump, breast tenderness and nipple discharge Hematologic/lymphatic: negative for bleeding  and easy bruising Musculoskeletal:negative for back pain and muscle weakness Neurological: negative for dizziness, headaches, vertigo and weakness Endocrine: negative for diabetic symptoms including polydipsia, polyuria and skin dryness Allergic/Immunologic: negative for hay fever and urticaria       Objective:  Blood pressure (!) 139/97, pulse (!) 105, height 5' 2"  (1.575 m), weight 163 lb 1.6 oz (74 kg). Body mass index is 29.83 kg/m.  General Appearance:    Alert, cooperative, no distress, appears stated age, overweight.   Head:    Normocephalic, without obvious abnormality, atraumatic  Eyes:    PERRL, conjunctiva/corneas clear, EOM's intact, both eyes  Ears:    Normal external ear canals, both ears  Nose:   Nares normal, septum midline, mucosa normal, no drainage or sinus tenderness  Throat:   Lips, mucosa, and tongue normal; teeth and gums normal  Neck:   Supple, symmetrical, trachea midline, no adenopathy; thyroid: no enlargement/tenderness/nodules; no carotid bruit or JVD  Back:     Symmetric, no curvature, ROM normal, no CVA tenderness  Lungs:     Clear to auscultation bilaterally, respirations unlabored  Chest Wall:    No tenderness or deformity   Heart:    Regular rate and rhythm, S1 and S2 normal, no murmur, rub or gallop  Breast Exam:    No tenderness, masses, or nipple abnormality  Abdomen:     Soft, non-tender, bowel sounds active all four quadrants, no masses, no organomegaly.    Genitalia:    Pelvic:external genitalia normal, vagina without lesions, discharge, or tenderness, rectovaginal septum  normal. Cervix normal in appearance, no cervical motion tenderness, no adnexal masses or tenderness. IUD threads visible, ~ 1.5 cm in length. Uterus normal size, shape, mobile, regular contours, nontender.   Microscopic wet-mount exam shows numerous clue cells, no hyphae, no trichomonads, no white blood cells. KOH done.   Rectal:    Normal external sphincter.  No hemorrhoids  appreciated. Internal exam not done.   Extremities:   Extremities normal, atraumatic, no cyanosis or edema  Pulses:   2+ and symmetric all extremities  Skin:   Skin color, texture, turgor normal, no rashes or lesions  Lymph nodes:   Cervical, supraclavicular, and axillary nodes normal  Neurologic:   CNII-XII intact, normal strength, sensation and reflexes throughout   .  Labs:  Lab Results  Component Value Date   WBC 10.7 03/24/2019   HGB 12.6 03/24/2019   HCT 39.4 03/24/2019   MCV 86 03/24/2019   PLT 398 03/24/2019    Lab Results  Component Value Date   CREATININE 0.73 03/24/2019   BUN 16 03/24/2019   NA 137 03/24/2019   K 4.0 03/24/2019   CL 103 03/24/2019   CO2 22 03/24/2019  Lab Results  Component Value Date   ALT 13 03/24/2018   AST 16 03/24/2018   ALKPHOS 87 03/24/2018   BILITOT 0.3 03/24/2018    Lab Results  Component Value Date   TSH 1.220 08/22/2014     Assessment:   Healthy female exam.  Cervical cancer screening Breast cancer screening Overweight IUD in place Elevated blood pressure  Plan:    Blood tests: See orders. Breast self exam technique reviewed and patient encouraged to perform self-exam monthly. Contraception: Liletta IUD in place. Can remove in 6 years.  Discussed healthy lifestyle modifications. Mammogram ordered.  Discussed need for colon cancer screening as recommendations have changed to begin screening at age 90. Discussed different options for screening, including colonoscopy, Cologuard, and FOBT testing. Patient opts to do Cologuard for now. May consider colonoscopy at later time.  Pap smear up to date. Past history of mild pap abnormalities with most recent pap smear normal. Can continue routine screening.   Bacterial vaginosis infection noted on wet prep.  Will treat with Metrogel.  Declines flu vaccine.  Declines COVID vaccination.  Elevated BP (mild), no prior history of HTN, however patient does have CKD, likely  secondary to lupus. Will continue to monitor BPs at future appointments.  Follow up in 1 year.    Rubie Maid, MD [facility]

## 2020-06-04 NOTE — Patient Instructions (Addendum)
Preventive Care 84-46 Years Old, Female Preventive care refers to lifestyle choices and visits with your health care provider that can promote health and wellness. This includes:  A yearly physical exam. This is also called an annual wellness visit.  Regular dental and eye exams.  Immunizations.  Screening for certain conditions.  Healthy lifestyle choices, such as: ? Eating a healthy diet. ? Getting regular exercise. ? Not using drugs or products that contain nicotine and tobacco. ? Limiting alcohol use. What can I expect for my preventive care visit? Physical exam Your health care provider will check your:  Height and weight. These may be used to calculate your BMI (body mass index). BMI is a measurement that tells if you are at a healthy weight.  Heart rate and blood pressure.  Body temperature.  Skin for abnormal spots. Counseling Your health care provider may ask you questions about your:  Past medical problems.  Family's medical history.  Alcohol, tobacco, and drug use.  Emotional well-being.  Home life and relationship well-being.  Sexual activity.  Diet, exercise, and sleep habits.  Work and work Statistician.  Access to firearms.  Method of birth control.  Menstrual cycle.  Pregnancy history. What immunizations do I need? Vaccines are usually given at various ages, according to a schedule. Your health care provider will recommend vaccines for you based on your age, medical history, and lifestyle or other factors, such as travel or where you work.   What tests do I need? Blood tests  Lipid and cholesterol levels. These may be checked every 5 years, or more often if you are over 46 years old.  Hepatitis C test.  Hepatitis B test. Screening  Lung cancer screening. You may have this screening every year starting at age 46 if you have a 30-pack-year history of smoking and currently smoke or have quit within the past 15 years.  Colorectal cancer  screening. ? All adults should have this screening starting at age 46 and continuing until age 17. ? Your health care provider may recommend screening at age 46 if you are at increased risk. ? You will have tests every 1-10 years, depending on your results and the type of screening test.  Diabetes screening. ? This is done by checking your blood sugar (glucose) after you have not eaten for a while (fasting). ? You may have this done every 1-3 years.  Mammogram. ? This may be done every 1-2 years. ? Talk with your health care provider about when you should start having regular mammograms. This may depend on whether you have a family history of breast cancer.  BRCA-related cancer screening. This may be done if you have a family history of breast, ovarian, tubal, or peritoneal cancers.  Pelvic exam and Pap test. ? This may be done every 3 years starting at age 46. ? Starting at age 11, this may be done every 5 years if you have a Pap test in combination with an HPV test starting at age 46. in combination with an HPV test. Other tests  STD (sexually transmitted disease) testing, if you are at risk.  Bone density scan. This is done to screen for osteoporosis. You may have this scan if you are at high risk for osteoporosis. Talk with your health care provider about your test results, treatment options, and if necessary, the need for more tests. Follow these instructions at home: Eating and drinking  Eat a diet that includes fresh fruits and vegetables, whole grains, lean protein, and low-fat dairy products.  Take vitamin and mineral supplements  as recommended by your health care provider.  Do not drink alcohol if: ? Your health care provider tells you not to drink. ? You are pregnant, may be pregnant, or are planning to become pregnant.  If you drink alcohol: ? Limit how much you have to 0-1 drink a day. ? Be aware of how much alcohol is in your drink. In the [place] one drink equals one 12 oz bottle of beer (355 mL), one 5 oz glass of  wine (148 mL), or one 1 oz glass of hard liquor (44 mL).   Lifestyle  Take daily care of your teeth and gums. Brush your teeth every morning and night with fluoride toothpaste. Floss one time each day.  Stay active. Exercise for at least 30 minutes 5 or more days each week.  Do not use any products that contain nicotine or tobacco, such as cigarettes, e-cigarettes, and chewing tobacco. If you need help quitting, ask your health care provider.  Do not use drugs.  If you are sexually active, practice safe sex. Use a condom or other form of protection to prevent STIs (sexually transmitted infections).  If you do not wish to become pregnant, use a form of birth control. If you plan to become pregnant, see your health care provider for a prepregnancy visit.  If told by your health care provider, take low-dose aspirin daily starting at age 50.  Find healthy ways to cope with stress, such as: ? Meditation, yoga, or listening to music. ? Journaling. ? Talking to a trusted person. ? Spending time with friends and family. Safety  Always wear your seat belt while driving or riding in a vehicle.  Do not drive: ? If you have been drinking alcohol. Do not ride with someone who has been drinking. ? When you are tired or distracted. ? While texting.  Wear a helmet and other protective equipment during sports activities.  If you have firearms in your house, make sure you follow all gun safety procedures. What's next?  Visit your health care provider once a year for an annual wellness visit.  Ask your health care provider how often you should have your eyes and teeth checked.  Stay up to date on all vaccines. This information is not intended to replace advice given to you by your health care provider. Make sure you discuss any questions you have with your health care provider. Document Revised: 11/28/2019 Document Reviewed: 11/04/2017 Elsevier Patient Education  2021 Elsevier Inc. Breast  Self-Awareness Breast self-awareness is knowing how your breasts look and feel. Doing breast self-awareness is important. It allows you to catch a breast problem early while it is still small and can be treated. All women should do breast self-awareness, including women who have had breast implants. Tell your doctor if you notice a change in your breasts. What you need:  A mirror.  A well-lit room. How to do a breast self-exam A breast self-exam is one way to learn what is normal for your breasts and to check for changes. To do a breast self-exam: Look for changes 1. Take off all the clothes above your waist. 2. Stand in front of a mirror in a room with good lighting. 3. Put your hands on your hips. 4. Push your hands down. 5. Look at your breasts and nipples in the mirror to see if one breast or nipple looks different from the other. Check to see if: ? The shape of one breast is different. ? The size of   size of one breast is different. ? There are wrinkles, dips, and bumps in one breast and not the other. 6. Look at each breast for changes in the skin, such as: ? Redness. ? Scaly areas. 7. Look for changes in your nipples, such as: ? Liquid around the nipples. ? Bleeding. ? Dimpling. ? Redness. ? A change in where the nipples are.   Feel for changes 1. Lie on your back on the floor. 2. Feel each breast. To do this, follow these steps: ? Pick a breast to feel. ? Put the arm closest to that breast above your head. ? Use your other arm to feel the nipple area of your breast. Feel the area with the pads of your three middle fingers by making small circles with your fingers. For the first circle, press lightly. For the second circle, press harder. For the third circle, press even harder. ? Keep making circles with your fingers at the different pressures as you move down your breast. Stop when you feel your ribs. ? Move your fingers a little toward the center of your  body. ? Start making circles with your fingers again, this time going up until you reach your collarbone. ? Keep making up-and-down circles until you reach your armpit. Remember to keep using the three pressures. ? Feel the other breast in the same way. 3. Sit or stand in the tub or shower. 4. With soapy water on your skin, feel each breast the same way you did in step 2 when you were lying on the floor.   Write down what you find Writing down what you find can help you remember what to tell your doctor. Write down:  What is normal for each breast.  Any changes you find in each breast, including: ? The kind of changes you find. ? Whether you have pain. ? Size and location of any lumps.  When you last had your menstrual period. General tips  Check your breasts every month.  If you are breastfeeding, the best time to check your breasts is after you feed your baby or after you use a breast pump.  If you get menstrual periods, the best time to check your breasts is 5-7 days after your menstrual period is over.  With time, you will become comfortable with the self-exam, and you will begin to know if there are changes in your breasts. Contact a doctor if you:  See a change in the shape or size of your breasts or nipples.  See a change in the skin of your breast or nipples, such as red or scaly skin.  Have fluid coming from your nipples that is not normal.  Find a lump or thick area that was not there before.  Have pain in your breasts.  Have any concerns about your breast health. Summary  Breast self-awareness includes looking for changes in your breasts, as well as feeling for changes within your breasts.  Breast self-awareness should be done in front of a mirror in a well-lit room.  You should check your breasts every month. If you get menstrual periods, the best time to check your breasts is 5-7 days after your menstrual period is over.  Let your doctor know of any changes  you see in your breasts, including changes in size, changes on the skin, pain or tenderness, or fluid from your nipples that is not normal. This information is not intended to replace advice given to you by your health care provider.  Make sure you discuss any questions you have with your health care provider. Document Revised: 10/12/2017 Document Reviewed: 10/12/2017 Elsevier Patient Education  2021 [place].    Bacterial Vaginosis  Bacterial vaginosis is an infection of the vagina. It happens when too many normal germs (healthy bacteria) grow in the vagina. This infection can make it easier to get other infections from sex (STIs). It is very important for pregnant women to get treated. This infection can cause babies to be born early or at a low birth weight. What are the causes? This infection is caused by an increase in certain germs that grow in the vagina. You cannot get this infection from toilet seats, bedsheets, swimming pools, or things that touch your vagina. What increases the risk?  Having sex with a new person or more than one person.  Having sex without protection.  Douching.  Having an intrauterine device (IUD).  Smoking.  Using drugs or drinking alcohol. These can lead you to do things that are risky.  Taking certain antibiotic medicines.  Being pregnant. What are the signs or symptoms? Some women have no symptoms. Symptoms may include:  A discharge from your vagina. It may be gray or white. It can be watery or foamy.  A fishy smell. This can happen after sex or during your menstrual period.  Itching in and around your vagina.  A feeling of burning or pain when you pee (urinate). How is this treated? This infection is treated with antibiotic medicines. These may be given to you as:  A pill.  A cream for your vagina.  A medicine that you put into your vagina (suppository). If the infection comes back after treatment, you may need more  antibiotics. Follow these instructions at home: Medicines  Take over-the-counter and prescription medicines as told by your doctor.  Take or use your antibiotic medicine as told by your doctor. Do not stop taking or using it, even if you start to feel better. General instructions  If the person you have sex with is a woman, tell her that you have this infection. She will need to follow up with her doctor. If you have a female partner, he does not need to be treated.  Do not have sex until you finish treatment.  Drink enough fluid to keep your pee pale yellow.  Keep your vagina and butt clean. ? Wash the area with warm water each day. ? Wipe from front to back after you use the toilet.  If you are breastfeeding a baby, ask your doctor if you should keep doing so during treatment.  Keep all follow-up visits. How is this prevented? Self-care  Do not douche.  Use only warm water to wash around your vagina.  Wear underwear that is cotton or lined with cotton.  Do not wear tight pants and pantyhose, especially in the summer. Safe sex  Use protection when you have sex. This includes: ? Use condoms. ? Use dental dams. This is a thin layer that protects the mouth during oral sex.  Limit how many people you have sex with. To prevent this infection, it is best to have sex with just one person.  Get tested for STIs. The person you have sex with should also get tested. Drugs and alcohol  Do not smoke or use any products that contain nicotine or tobacco. If you need help quitting, ask your doctor.  Do not use drugs.  Do not drink alcohol if: ? Your doctor tells you not  to drink. ? You are pregnant, may be pregnant, or are planning to become pregnant.  If you drink alcohol: ? Limit how much you have to 0-1 drink a day. ? Know how much alcohol is in your drink. In the [place] one drink equals one 12 oz bottle of beer (355 mL), one 5 oz glass of wine (148 mL), or one 1 oz glass of  hard liquor (44 mL). Where to find more information  Centers for Disease Control and Prevention: http://www.wolf.info/  American Sexual Health Association: www.ashastd.org  Office on [employer]: [place] Contact a doctor if:  Your symptoms do not get better, even after you are treated.  You have more discharge or pain when you pee.  You have a fever or chills.  You have pain in your belly (abdomen) or in the area between your hips.  You have pain with sex.  You bleed from your vagina between menstrual periods. Summary  This infection can happen when too many germs (bacteria) grow in the vagina.  This infection can make it easier to get infections from sex (STIs). Treating this can lower that chance.  Get treated if you are pregnant. This infection can cause babies to be born early.  Do not stop taking or using your antibiotic medicine, even if you start to feel better. This information is not intended to replace advice given to you by your health care provider. Make sure you discuss any questions you have with your health care provider. Document Revised: 08/24/2019 Document Reviewed: 08/24/2019 Elsevier Patient Education  [place].

## 2020-06-04 NOTE — Progress Notes (Signed)
Pt present for year exam. Pt stated that she was doing well.

## 2020-06-05 LAB — COMPREHENSIVE METABOLIC PANEL
ALT: 9 IU/L (ref 0–32)
AST: 15 IU/L (ref 0–40)
Albumin/Globulin Ratio: 1.5 (ref 1.2–2.2)
Albumin: 4.4 g/dL (ref 3.8–4.8)
Alkaline Phosphatase: 77 IU/L (ref 44–121)
BUN/Creatinine Ratio: 13 (ref 9–23)
BUN: 11 mg/dL (ref 6–24)
Bilirubin Total: <0.2 mg/dL (ref 0.0–1.2)
CO2: 22 mmol/L (ref 20–29)
Calcium: 9.1 mg/dL (ref 8.7–10.2)
Chloride: 102 mmol/L (ref 96–106)
Creatinine, Ser: 0.84 mg/dL (ref 0.57–1.00)
Globulin, Total: 2.9 g/dL (ref 1.5–4.5)
Glucose: 93 mg/dL (ref 65–99)
Potassium: 4.2 mmol/L (ref 3.5–5.2)
Sodium: 136 mmol/L (ref 134–144)
Total Protein: 7.3 g/dL (ref 6.0–8.5)
eGFR: 87 mL/min/{1.73_m2} (ref >59)

## 2020-06-05 LAB — LIPID PANEL
Chol/HDL Ratio: 3.3 ratio (ref 0.0–4.4)
Cholesterol, Total: 180 mg/dL (ref 100–199)
HDL: 55 mg/dL (ref >39)
LDL Chol Calc (NIH): 107 mg/dL — ABNORMAL HIGH (ref 0–99)
Triglycerides: 100 mg/dL (ref 0–149)
VLDL Cholesterol Cal: 18 mg/dL (ref 5–40)

## 2020-06-05 LAB — CBC
Hematocrit: 37.5 % (ref 34.0–46.6)
Hemoglobin: 12.6 g/dL (ref 11.1–15.9)
MCH: 28.4 pg (ref 26.6–33.0)
MCHC: 33.6 g/dL (ref 31.5–35.7)
MCV: 85 fL (ref 79–97)
Platelets: 375 10*3/uL (ref 150–450)
RBC: 4.43 x10E6/uL (ref 3.77–5.28)
RDW: 13.1 % (ref 11.7–15.4)
WBC: 11.3 10*3/uL — ABNORMAL HIGH (ref 3.4–10.8)

## 2020-06-05 LAB — HEMOGLOBIN A1C
Est. average glucose Bld gHb Est-mCnc: 120 mg/dL
Hgb A1c MFr Bld: 5.8 % — ABNORMAL HIGH (ref 4.8–5.6)

## 2020-06-05 LAB — TSH: TSH: 1.24 u[IU]/mL (ref 0.450–4.500)

## 2020-06-19 ENCOUNTER — Other Ambulatory Visit: Payer: Self-pay

## 2020-06-19 ENCOUNTER — Ambulatory Visit: Payer: BC Managed Care – PPO | Admitting: Family Medicine

## 2020-06-19 ENCOUNTER — Encounter: Payer: Self-pay | Admitting: Family Medicine

## 2020-06-19 VITALS — BP 110/78 | HR 87 | Temp 98.0°F | Ht 62.0 in | Wt 162.8 lb

## 2020-06-19 DIAGNOSIS — D72829 Elevated white blood cell count, unspecified: Secondary | ICD-10-CM

## 2020-06-19 DIAGNOSIS — R7303 Prediabetes: Secondary | ICD-10-CM | POA: Diagnosis not present

## 2020-06-19 DIAGNOSIS — R41840 Attention and concentration deficit: Secondary | ICD-10-CM | POA: Diagnosis not present

## 2020-06-19 LAB — CBC WITH DIFFERENTIAL/PLATELET
Basophils Absolute: 0.1 10*3/uL (ref 0.0–0.2)
Basos: 1 %
EOS (ABSOLUTE): 0.1 10*3/uL (ref 0.0–0.4)
Eos: 1 %
Hematocrit: 39.2 % (ref 34.0–46.6)
Hemoglobin: 12.6 g/dL (ref 11.1–15.9)
Immature Grans (Abs): 0 10*3/uL (ref 0.0–0.1)
Immature Granulocytes: 0 %
Lymphocytes Absolute: 2.7 10*3/uL (ref 0.7–3.1)
Lymphs: 26 %
MCH: 27.5 pg (ref 26.6–33.0)
MCHC: 32.1 g/dL (ref 31.5–35.7)
MCV: 86 fL (ref 79–97)
Monocytes Absolute: 0.7 10*3/uL (ref 0.1–0.9)
Monocytes: 7 %
Neutrophils Absolute: 6.8 10*3/uL (ref 1.4–7.0)
Neutrophils: 65 %
Platelets: 420 10*3/uL (ref 150–450)
RBC: 4.58 x10E6/uL (ref 3.77–5.28)
RDW: 13.2 % (ref 11.7–15.4)
WBC: 10.4 10*3/uL (ref 3.4–10.8)

## 2020-06-19 NOTE — Patient Instructions (Signed)
Prediabetes Prediabetes is when your blood sugar (blood glucose) level is higher than normal but not high enough for you to be diagnosed with type 2 diabetes. Having prediabetes puts you at risk for developing type 2 diabetes (type 2 diabetes mellitus). With certain lifestyle changes, you may be able to prevent or delay the onset of type 2 diabetes. This is important because type 2 diabetes can lead to serious complications, such as:  Heart disease.  Stroke.  Blindness.  Kidney disease.  Depression.  Poor circulation in the feet and legs. In severe cases, this could lead to surgical removal of a leg (amputation). What are the causes? The exact cause of prediabetes is not known. It may result from insulin resistance. Insulin resistance develops when cells in the body do not respond properly to insulin that the body makes. This can cause excess glucose to build up in the blood. High blood glucose (hyperglycemia) can develop. What increases the risk? The following factors may make you more likely to develop this condition:  You have a family member with type 2 diabetes.  You are older than 45 years.  You had a temporary form of diabetes during a pregnancy (gestational diabetes).  You had polycystic ovary syndrome (PCOS).  You are overweight or obese.  You are inactive (sedentary).  You have a history of heart disease, including problems with cholesterol levels, high levels of blood fats, or high blood pressure. What are the signs or symptoms? You may have no symptoms. If you do have symptoms, they may include:  Increased hunger.  Increased thirst.  Increased urination.  Vision changes, such as blurry vision.  Tiredness (fatigue). How is this diagnosed? This condition can be diagnosed with blood tests. Your blood glucose may be checked with one or more of the following tests:  A fasting blood glucose (FBG) test. You will not be allowed to eat (you will fast) for at least  8 hours before a blood sample is taken.  An A1C blood test (hemoglobin A1C). This test provides information about blood glucose levels over the previous 2?3 months.  An oral glucose tolerance test (OGTT). This test measures your blood glucose at two points in time: ? After fasting. This is your baseline level. ? Two hours after you drink a beverage that contains glucose. You may be diagnosed with prediabetes if:  Your FBG is 100?125 mg/dL (5.6-6.9 mmol/L).  Your A1C level is 5.7?6.4% (39-46 mmol/mol).  Your OGTT result is 140?199 mg/dL (7.8-11 mmol/L). These blood tests may be repeated to confirm your diagnosis.   How is this treated? Treatment may include dietary and lifestyle changes to help lower your blood glucose and prevent type 2 diabetes from developing. In some cases, medicine may be prescribed to help lower the risk of type 2 diabetes. Follow these instructions at home: Nutrition  Follow a healthy meal plan. This includes eating lean proteins, whole grains, legumes, fresh fruits and vegetables, low-fat dairy products, and healthy fats.  Follow instructions from your health care provider about eating or drinking restrictions.  Meet with a dietitian to create a healthy eating plan that is right for you.   Lifestyle  Do moderate-intensity exercise for at least 30 minutes a day on 5 or more days each week, or as told by your health care provider. A mix of activities may be best, such as: ? Brisk walking, swimming, biking, and weight lifting.  Lose weight as told by your health care provider. Losing 5-7% of your body  weight can reverse insulin resistance.  Do not drink alcohol if: ? Your health care provider tells you not to drink. ? You are pregnant, may be pregnant, or are planning to become pregnant.  If you drink alcohol: ? Limit how much you use to:  0-1 drink a day for women.  0-2 drinks a day for men. ? Be aware of how much alcohol is in your drink. In the [place]  one drink equals one 12 oz bottle of beer (355 mL), one 5 oz glass of wine (148 mL), or one 1 oz glass of hard liquor (44 mL). General instructions  Take over-the-counter and prescription medicines only as told by your health care provider. You may be prescribed medicines that help lower the risk of type 2 diabetes.  Do not use any products that contain nicotine or tobacco, such as cigarettes, e-cigarettes, and chewing tobacco. If you need help quitting, ask your health care provider.  Keep all follow-up visits. This is important. Where to find more information  American Diabetes Association: www.diabetes.org  Academy of Nutrition and Dietetics: www.eatright.org  American Heart Association: www.heart.org Contact a health care provider if:  You have any of these symptoms: ? Increased hunger. ? Increased urination. ? Increased thirst. ? Fatigue. ? Vision changes, such as blurry vision. Get help right away if you:  Have shortness of breath.  Feel confused.  Vomit or feel like you may vomit. Summary  Prediabetes is when your blood sugar (blood glucose)level is higher than normal but not high enough for you to be diagnosed with type 2 diabetes.  Having prediabetes puts you at risk for developing type 2 diabetes (type 2 diabetes mellitus).  Make lifestyle changes such as eating a healthy diet and exercising regularly to help prevent diabetes. Lose weight as told by your health care provider. This information is not intended to replace advice given to you by your health care provider. Make sure you discuss any questions you have with your health care provider. Document Revised: 05/25/2019 Document Reviewed: 05/25/2019 Elsevier Patient Education  [place].

## 2020-06-19 NOTE — Progress Notes (Signed)
Acute Office Visit  Subjective:    Patient ID: Meghan Foster, female    DOB: [birth year], 45 y.o.   MRN: [id number]  Chief Complaint  Patient presents with  . Diabetes    Hx of family DM. Patient went to GYN and they done A1c on 06/04/20 it was 5.8.  . ADHD    Friend states she has a lot of symptoms told patient she would have to schedule with pcp more than likely for that. Cant focus on things     HPI Patient is in today for elevated A1c and possible ADD.  [name] recently saw her GYN and had labs done. Her A1c was 5.8. She has also had an elevated A1c in the past. She has a family history of DM and she is worried about DM for herself. She does not currently exercise. She typically drinks one soda a day and she likes sweets. Her WBC was a little elevated as well.  She is wondering if she has ADD. She has had a couple co workers with ADD mention that maybe she has it as well. She reports difficulty focusing. She often starts a project and then starts another before finishing the first. She also notices that she interrupts people when they are talking. Her daughter has ADHD.   Past Medical History:  Diagnosis Date  . Anemia    iron deficiency  . Chronic kidney disease    [facility]  . History of genital warts   . IUD (intrauterine device) in place   . Lupus ([place])   . Lupus ([place])    renal involvement wiht HTN & edema    Past Surgical History:  Procedure Laterality Date  . RENAL BIOPSY Left December 2012   [facility]  . WISDOM TOOTH EXTRACTION      Family History  Problem Relation Age of Onset  . Diabetes Mother   . Healthy Father   . Healthy Sister   . Healthy Brother   . Breast cancer Paternal Aunt     Social History   Socioeconomic History  . Marital status: Divorced    Spouse name: Not on file  . Number of children: Not on file  . Years of education: Not on file  . Highest education level: Not on file  Occupational History  . Not on file   Tobacco Use  . Smoking status: Never Smoker  . Smokeless tobacco: Never Used  Vaping Use  . Vaping Use: Never used  Substance and Sexual Activity  . Alcohol use: No    Alcohol/week: 0.0 standard drinks  . Drug use: No  . Sexual activity: Yes    Birth control/protection: I.U.D.  Other Topics Concern  . Not on file  Social History Narrative  . Not on file   Social Determinants of Health   Financial Resource Strain: Not on file  Food Insecurity: Not on file  Transportation Needs: Not on file  Physical Activity: Not on file  Stress: Not on file  Social Connections: Not on file  Intimate Partner Violence: Not on file    Outpatient Medications Prior to Visit  Medication Sig Dispense Refill  . levonorgestrel (LILETTA, 52 MG,) 19.5 MCG/DAY IUD IUD 1 each by Intrauterine route once.    . metroNIDAZOLE (METROGEL) 0.75 % vaginal gel Place 1 Applicatorful vaginally at bedtime. Apply one applicatorful to vagina at bedtime for 5 days 70 g 1   No facility-administered medications prior to visit.    No Known Allergies  Review of Systems As per HPI.     Objective:    Physical Exam Vitals and nursing note reviewed.  Constitutional:      General: She is not in acute distress.    Appearance: Normal appearance. She is not ill-appearing, toxic-appearing or diaphoretic.  HENT:     Head: Normocephalic and atraumatic.  Pulmonary:     Effort: Pulmonary effort is normal. No respiratory distress.  Musculoskeletal:     Right lower leg: No edema.     Left lower leg: No edema.  Skin:    General: Skin is warm and dry.  Neurological:     General: No focal deficit present.     Mental Status: She is alert and oriented to person, place, and time.  Psychiatric:        Mood and Affect: Mood normal.        Behavior: Behavior normal.        Thought Content: Thought content normal.        Judgment: Judgment normal.     BP 110/78   Pulse 87   Temp 98 F (36.7 C) (Temporal)   Ht 5' 2"   (1.575 m)   Wt 162 lb 12.8 oz (73.8 kg)   BMI 29.78 kg/m  Wt Readings from Last 3 Encounters:  06/19/20 162 lb 12.8 oz (73.8 kg)  06/04/20 163 lb 1.6 oz (74 kg)  12/15/19 157 lb (71.2 kg)    Health Maintenance Due  Topic Date Due  . Hepatitis C Screening  Never done  . TETANUS/TDAP  Never done  . COLONOSCOPY (Pts 45-52yr Insurance coverage will need to be confirmed)  Never done    There are no preventive care reminders to display for this patient.   Lab Results  Component Value Date   TSH 1.240 06/04/2020   Lab Results  Component Value Date   WBC 11.3 (H) 06/04/2020   HGB 12.6 06/04/2020   HCT 37.5 06/04/2020   MCV 85 06/04/2020   PLT 375 06/04/2020   Lab Results  Component Value Date   NA 136 06/04/2020   K 4.2 06/04/2020   CO2 22 06/04/2020   GLUCOSE 93 06/04/2020   BUN 11 06/04/2020   CREATININE 0.84 06/04/2020   BILITOT <0.2 06/04/2020   ALKPHOS 77 06/04/2020   AST 15 06/04/2020   ALT 9 06/04/2020   PROT 7.3 06/04/2020   ALBUMIN 4.4 06/04/2020   CALCIUM 9.1 06/04/2020   ANIONGAP 8 04/18/2012   Lab Results  Component Value Date   CHOL 180 06/04/2020   Lab Results  Component Value Date   HDL 55 06/04/2020   Lab Results  Component Value Date   LDLCALC 107 (H) 06/04/2020   Lab Results  Component Value Date   TRIG 100 06/04/2020   Lab Results  Component Value Date   CHOLHDL 3.3 06/04/2020   Lab Results  Component Value Date   HGBA1C 5.8 (H) 06/04/2020       Assessment & Plan:   JCristiwas seen today for diabetes and adhd.  Diagnoses and all orders for this visit:  Prediabetes A1c of 5.8 on 06/04/20. Discussed lifestyles changes to lower A1c. Handout given.   Leukocytosis, unspecified type WBC 11.3 on 06/04/20. No other significant abnormalities. Will recheck today.  -     CBC with Differential  Inattention Patient will schedule appointment with PCP to discuss.   Return to office for new or worsening symptoms, or if symptoms  persist.   The patient indicates  understanding of these issues and agrees with the plan.  Gwenlyn Perking, FNP

## 2020-08-01 ENCOUNTER — Encounter: Payer: Self-pay | Admitting: Family Medicine

## 2020-08-01 ENCOUNTER — Telehealth: Payer: BC Managed Care – PPO | Admitting: Family Medicine

## 2020-08-01 DIAGNOSIS — F419 Anxiety disorder, unspecified: Secondary | ICD-10-CM | POA: Diagnosis not present

## 2020-08-01 DIAGNOSIS — R41840 Attention and concentration deficit: Secondary | ICD-10-CM

## 2020-08-01 MED ORDER — BUPROPION HCL ER (XL) 150 MG PO TB24: 150.0000 mg | ORAL_TABLET | Freq: Every day | ORAL | 1 refills | Status: DC

## 2020-08-01 NOTE — Progress Notes (Signed)
Virtual Visit via my chart video Note  I connected with Meghan Foster on 08/01/20 at 0754 by video and verified that I am speaking with the correct person using two identifiers. Meghan Foster is currently located at home and patient are currently with her during visit. The provider, Fransisca Kaufmann Mateusz Neilan, MD is located in their office at time of visit.  Call ended at [phone]  I discussed the limitations, risks, security and privacy concerns of performing an evaluation and management service by video and the availability of in person appointments. I also discussed with the patient that there may be a patient responsible charge related to this service. The patient expressed understanding and agreed to proceed.   History and Present Illness: Patient is calling in for attention issues.  She does get some bursts of energy.  She takes a while to get started. She starts something and then can't finish it and then she gets overwhelmed with tasks and then her anxiety builds. She does feel sad when she feels overwhelmed. She was treated for depression initially when she was diagnosed with lupus. She does sleep but her mind doesn't shut down quickly.  She has increased appetite when she is overwhelmed.  She denies suicidal ideations. She has noticed this over the past year.    No diagnosis found.  Outpatient Encounter Medications as of 08/01/2020  Medication Sig  . levonorgestrel (LILETTA, 52 MG,) 19.5 MCG/DAY IUD IUD 1 each by Intrauterine route once.  . metroNIDAZOLE (METROGEL) 0.75 % vaginal gel Place 1 Applicatorful vaginally at bedtime. Apply one applicatorful to vagina at bedtime for 5 days   No facility-administered encounter medications on file as of 08/01/2020.    Review of Systems  Constitutional: Negative for chills and fever.  Respiratory: Negative for chest tightness and shortness of breath.   Cardiovascular: Negative for chest pain and leg swelling.  Skin: Negative for rash.   Neurological: Positive for headaches.  Psychiatric/Behavioral: Positive for decreased concentration and sleep disturbance. Negative for agitation, behavioral problems, dysphoric mood, self-injury and suicidal ideas. The patient is nervous/anxious.   All other systems reviewed and are negative.   Observations/Objective:  patient sounds comfortable and in no acute distress  Assessment and Plan: Problem List Items Addressed This Visit   None   Visit Diagnoses    Anxiety    -  Primary   Relevant Medications   buPROPion (WELLBUTRIN XL) 150 MG 24 hr tablet   Attention deficit       Relevant Medications   buPROPion (WELLBUTRIN XL) 150 MG 24 hr tablet      Patient has a little bit of attention issues and a little bit of anxiety issues, will try Wellbutrin to see if that helps with both, warned of possible side effects, follow-up in 3 to 4 weeks Follow up plan: Return in about 4 weeks (around 08/29/2020), or if symptoms worsen or fail to improve, for attention and anxiety.     I discussed the assessment and treatment plan with the patient. The patient was provided an opportunity to ask questions and all were answered. The patient agreed with the plan and demonstrated an understanding of the instructions.   The patient was advised to call back or seek an in-person evaluation if the symptoms worsen or if the condition fails to improve as anticipated.  The above assessment and management plan was discussed with the patient. The patient verbalized understanding of and has agreed to the management plan. Patient is aware to call the  clinic if symptoms persist or worsen. Patient is aware when to return to the clinic for a follow-up visit. Patient educated on when it is appropriate to go to the emergency department.    I provided 20 minutes of non-face-to-face time during this encounter.    Worthy Rancher, MD

## 2020-08-24 ENCOUNTER — Other Ambulatory Visit: Payer: Self-pay | Admitting: Family Medicine

## 2020-08-24 DIAGNOSIS — R41840 Attention and concentration deficit: Secondary | ICD-10-CM

## 2020-08-24 DIAGNOSIS — F419 Anxiety disorder, unspecified: Secondary | ICD-10-CM

## 2020-12-06 ENCOUNTER — Other Ambulatory Visit: Payer: Self-pay | Admitting: Family Medicine

## 2020-12-06 DIAGNOSIS — R41840 Attention and concentration deficit: Secondary | ICD-10-CM

## 2020-12-06 DIAGNOSIS — F419 Anxiety disorder, unspecified: Secondary | ICD-10-CM

## 2020-12-20 ENCOUNTER — Other Ambulatory Visit: Payer: Self-pay | Admitting: Family Medicine

## 2020-12-20 DIAGNOSIS — R41840 Attention and concentration deficit: Secondary | ICD-10-CM

## 2020-12-20 DIAGNOSIS — F419 Anxiety disorder, unspecified: Secondary | ICD-10-CM

## 2021-05-12 ENCOUNTER — Other Ambulatory Visit: Payer: Self-pay | Admitting: Obstetrics and Gynecology

## 2021-05-12 DIAGNOSIS — Z1231 Encounter for screening mammogram for malignant neoplasm of breast: Secondary | ICD-10-CM

## 2021-05-19 ENCOUNTER — Ambulatory Visit: Payer: BC Managed Care – PPO | Admitting: Family Medicine

## 2021-05-19 ENCOUNTER — Encounter: Payer: Self-pay | Admitting: Family Medicine

## 2021-05-19 VITALS — BP 110/82 | HR 85 | Ht 62.0 in | Wt 170.0 lb

## 2021-05-19 DIAGNOSIS — Z1211 Encounter for screening for malignant neoplasm of colon: Secondary | ICD-10-CM | POA: Diagnosis not present

## 2021-05-19 DIAGNOSIS — Z1231 Encounter for screening mammogram for malignant neoplasm of breast: Secondary | ICD-10-CM

## 2021-05-19 DIAGNOSIS — M3214 Glomerular disease in systemic lupus erythematosus: Secondary | ICD-10-CM | POA: Diagnosis not present

## 2021-05-19 DIAGNOSIS — R030 Elevated blood-pressure reading, without diagnosis of hypertension: Secondary | ICD-10-CM

## 2021-05-19 DIAGNOSIS — F419 Anxiety disorder, unspecified: Secondary | ICD-10-CM

## 2021-05-19 DIAGNOSIS — Z13220 Encounter for screening for lipoid disorders: Secondary | ICD-10-CM

## 2021-05-19 DIAGNOSIS — Z23 Encounter for immunization: Secondary | ICD-10-CM

## 2021-05-19 DIAGNOSIS — R7303 Prediabetes: Secondary | ICD-10-CM | POA: Diagnosis not present

## 2021-05-19 LAB — BAYER DCA HB A1C WAIVED: HB A1C (BAYER DCA - WAIVED): 5.6 % (ref 4.8–5.6)

## 2021-05-19 NOTE — Progress Notes (Signed)
? ?BP (!) 121/95   Pulse 85   Ht 5' 2"  (1.575 m)   Wt 170 lb (77.1 kg)   SpO2 98%   BMI 31.09 kg/m?   ? ?Subjective:  ? ?Patient ID: Meghan Foster, female    DOB: 1976/01/11, 46 y.o.   MRN: [id number] ? ?HPI: ?Meghan Foster is a 46 y.o. female presenting on 05/19/2021 for Lupus ([place] on and off with fatigue and mild fevers) ? ? ?HPI ?Fatigue  and stress and decreased energy. ?Patient is coming in today complaining of decreased energy and fatigue sometimes having flareups of feeling heated and flushed.  She says she has been having this for about a week and a half and she does feel like it is stress related.  She did get a new position at her job required an extensive interview process which was very stressful but she did get the position and she is very happy but also has some stress now learning the new position.  She says she was feeling the symptoms which is very similar to when she feels like her lupus flared up in the past and wanted to check some blood work in accordance with that.  She used to be on hydroxychloroquine for the past but has been off of it for 2 years and has been doing okay until recently.  She does feel like the stresses flared this up a lot and she used to be on Wellbutrin as well and wants to talk about going back on Wellbutrin for stress. ? ?Relevant past medical, surgical, family and social history reviewed and updated as indicated. Interim medical history since our last visit reviewed. ?Allergies and medications reviewed and updated. ? ?Review of Systems  ?Constitutional:  Positive for fatigue. Negative for chills and fever.  ?Eyes:  Negative for visual disturbance.  ?Respiratory:  Negative for chest tightness and shortness of breath.   ?Cardiovascular:  Negative for chest pain and leg swelling.  ?Skin:  Negative for rash.  ?Neurological:  Negative for weakness, light-headedness, numbness and headaches.  ?Psychiatric/Behavioral:  Positive for decreased concentration,  dysphoric mood and sleep disturbance. Negative for agitation, behavioral problems, self-injury and suicidal ideas. The patient is nervous/anxious. The patient is not hyperactive.   ?All other systems reviewed and are negative. ? ?Per HPI unless specifically indicated above ? ? ?Allergies as of 05/19/2021   ?No Known Allergies ?  ? ?  ?Medication List  ?  ? ?  ? Accurate as of May 19, 2021  8:54 AM. If you have any questions, ask your nurse or doctor.  ?  ?  ? ?  ? ?STOP taking these medications   ? ?buPROPion 150 MG 24 hr tablet ?Commonly known as: WELLBUTRIN XL ?Stopped by: Meghan Rancher, MD ?  ?metroNIDAZOLE 0.75 % vaginal gel ?Commonly known as: METROGEL ?Stopped by: Meghan Rancher, MD ?  ? ?  ? ?TAKE these medications   ? ?Liletta (52 MG) 20.1 MCG/DAY Iud ?Generic drug: levonorgestrel ?1 each by Intrauterine route once. ?  ? ?  ? ? ? ?Objective:  ? ?BP (!) 121/95   Pulse 85   Ht 5' 2"  (1.575 m)   Wt 170 lb (77.1 kg)   SpO2 98%   BMI 31.09 kg/m?   ?Wt Readings from Last 3 Encounters:  ?05/19/21 170 lb (77.1 kg)  ?06/19/20 162 lb 12.8 oz (73.8 kg)  ?06/04/20 163 lb 1.6 oz (74 kg)  ?  ?Physical Exam ?Vitals and nursing note reviewed.  ?  Constitutional:   ?   General: She is not in acute distress. ?   Appearance: She is well-developed. She is not diaphoretic.  ?Eyes:  ?   Conjunctiva/sclera: Conjunctivae normal.  ?Cardiovascular:  ?   Rate and Rhythm: Normal rate and regular rhythm.  ?   Heart sounds: Normal heart sounds. No murmur heard. ?Pulmonary:  ?   Effort: Pulmonary effort is normal. No respiratory distress.  ?   Breath sounds: Normal breath sounds. No wheezing.  ?Musculoskeletal:     ?   General: No tenderness. Normal range of motion.  ?Skin: ?   General: Skin is warm and dry.  ?   Findings: No rash.  ?Neurological:  ?   Mental Status: She is alert and oriented to person, place, and time.  ?   Motor: No weakness.  ?   Coordination: Coordination normal.  ?   Gait: Gait normal.  ?Psychiatric:      ?   Attention and Perception: Attention and perception normal.     ?   Mood and Affect: Mood is anxious. Mood is not depressed.     ?   Behavior: Behavior normal.     ?   Thought Content: Thought content does not include suicidal ideation. Thought content does not include suicidal plan.  ? ? ? ? ?Assessment & Plan:  ? ?Problem List Items Addressed This Visit   ? ?  ? Other  ? Lupus (systemic lupus erythematosus) ([place]) - Primary  ? Relevant Orders  ? CBC with Differential/Platelet  ? CMP14+EGFR  ? TSH  ? Sedimentation rate  ? C-reactive protein  ? ?Other Visit Diagnoses   ? ? Colon cancer screening      ? Relevant Orders  ? Cologuard  ? Encounter for screening mammogram for malignant neoplasm of breast      ? Relevant Orders  ? MM 3D SCREEN BREAST BILATERAL  ? Need for Tdap vaccination      ? Relevant Orders  ? Tdap vaccine greater than or equal to 7yo IM  ? Anxiety      ? Relevant Orders  ? TSH  ? Prediabetes      ? Relevant Orders  ? Bayer DCA Hb A1c Waived  ? Elevated blood pressure reading      ? Lipid screening      ? Relevant Orders  ? Lipid panel  ? ?  ?  ?Recheck on blood pressure is 110/82 which looks good, just monitor occasionally at home. ? ?Patient has had prediabetes in the past, we will recheck the blood work for that as well. ?Follow up plan: ?Return in about 3 months (around 08/19/2021), or if symptoms worsen or fail to improve, for Anxiety and fatigue and stress recheck. ? ?Counseling provided for all of the vaccine components ?Orders Placed This Encounter  ?Procedures  ? MM 3D SCREEN BREAST BILATERAL  ? Tdap vaccine greater than or equal to 7yo IM  ? Cologuard  ? CBC with Differential/Platelet  ? CMP14+EGFR  ? Lipid panel  ? Bayer DCA Hb A1c Waived  ? TSH  ? Sedimentation rate  ? C-reactive protein  ? ? ?Caryl Pina, MD ?[place] ?05/19/2021, 8:54 AM ? ? ? ? ?

## 2021-05-20 LAB — CMP14+EGFR
ALT: 13 IU/L (ref 0–32)
AST: 15 IU/L (ref 0–40)
Albumin/Globulin Ratio: 1.7 (ref 1.2–2.2)
Albumin: 4.3 g/dL (ref 3.8–4.8)
Alkaline Phosphatase: 87 IU/L (ref 44–121)
BUN/Creatinine Ratio: 13 (ref 9–23)
BUN: 11 mg/dL (ref 6–24)
Bilirubin Total: 0.4 mg/dL (ref 0.0–1.2)
CO2: 22 mmol/L (ref 20–29)
Calcium: 9.2 mg/dL (ref 8.7–10.2)
Chloride: 101 mmol/L (ref 96–106)
Creatinine, Ser: 0.84 mg/dL (ref 0.57–1.00)
Globulin, Total: 2.6 g/dL (ref 1.5–4.5)
Glucose: 96 mg/dL (ref 70–99)
Potassium: 4.6 mmol/L (ref 3.5–5.2)
Sodium: 137 mmol/L (ref 134–144)
Total Protein: 6.9 g/dL (ref 6.0–8.5)
eGFR: 87 mL/min/{1.73_m2} (ref >59)

## 2021-05-20 LAB — TSH: TSH: 1.78 u[IU]/mL (ref 0.450–4.500)

## 2021-05-20 LAB — CBC WITH DIFFERENTIAL/PLATELET
Basophils Absolute: 0 10*3/uL (ref 0.0–0.2)
Basos: 0 %
EOS (ABSOLUTE): 0.2 10*3/uL (ref 0.0–0.4)
Eos: 1 %
Hematocrit: 42.2 % (ref 34.0–46.6)
Hemoglobin: 13.8 g/dL (ref 11.1–15.9)
Immature Grans (Abs): 0.1 10*3/uL (ref 0.0–0.1)
Immature Granulocytes: 1 %
Lymphocytes Absolute: 2.8 10*3/uL (ref 0.7–3.1)
Lymphs: 26 %
MCH: 27.9 pg (ref 26.6–33.0)
MCHC: 32.7 g/dL (ref 31.5–35.7)
MCV: 85 fL (ref 79–97)
Monocytes Absolute: 0.8 10*3/uL (ref 0.1–0.9)
Monocytes: 7 %
Neutrophils Absolute: 6.7 10*3/uL (ref 1.4–7.0)
Neutrophils: 65 %
Platelets: 370 10*3/uL (ref 150–450)
RBC: 4.95 x10E6/uL (ref 3.77–5.28)
RDW: 12.7 % (ref 11.7–15.4)
WBC: 10.5 10*3/uL (ref 3.4–10.8)

## 2021-05-20 LAB — LIPID PANEL
Chol/HDL Ratio: 3.8 ratio (ref 0.0–4.4)
Cholesterol, Total: 198 mg/dL (ref 100–199)
HDL: 52 mg/dL (ref >39)
LDL Chol Calc (NIH): 121 mg/dL — ABNORMAL HIGH (ref 0–99)
Triglycerides: 143 mg/dL (ref 0–149)
VLDL Cholesterol Cal: 25 mg/dL (ref 5–40)

## 2021-05-20 LAB — SEDIMENTATION RATE: Sed Rate: 27 mm/hr (ref 0–32)

## 2021-05-20 LAB — C-REACTIVE PROTEIN: CRP: 7 mg/L (ref 0–10)

## 2021-06-12 LAB — COLOGUARD: COLOGUARD: NEGATIVE

## 2021-08-30 IMAGING — MG DIGITAL SCREENING BILAT W/ TOMO W/ CAD
8 series · 9 of 24 positions shown · non-contrast
Comparison: Previous exam(s).

CLINICAL DATA: Screening.

EXAM:
DIGITAL SCREENING BILATERAL MAMMOGRAM WITH TOMO AND CAD

[R MLO synth-2D]
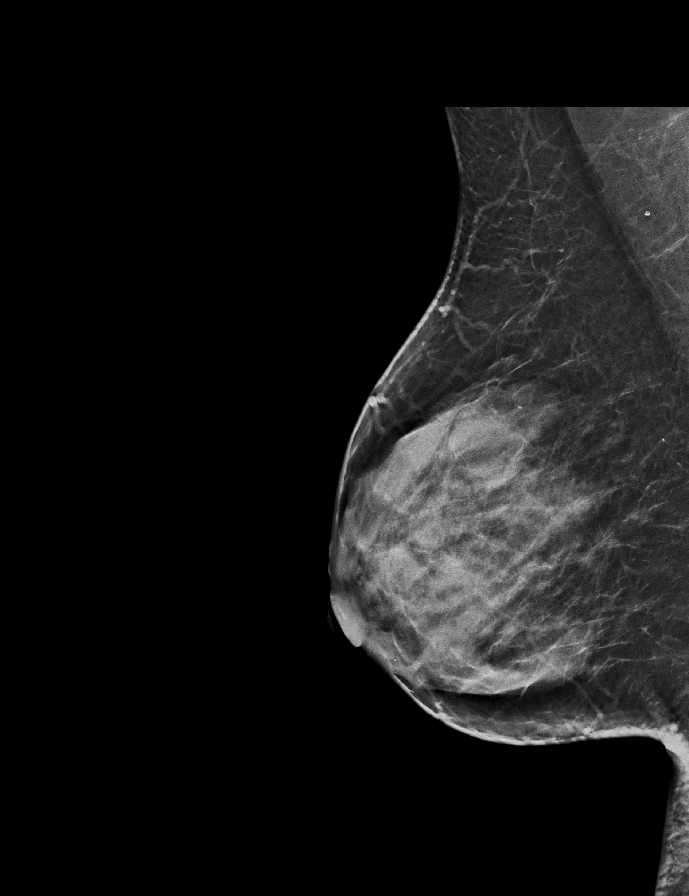

[R CC synth-2D]
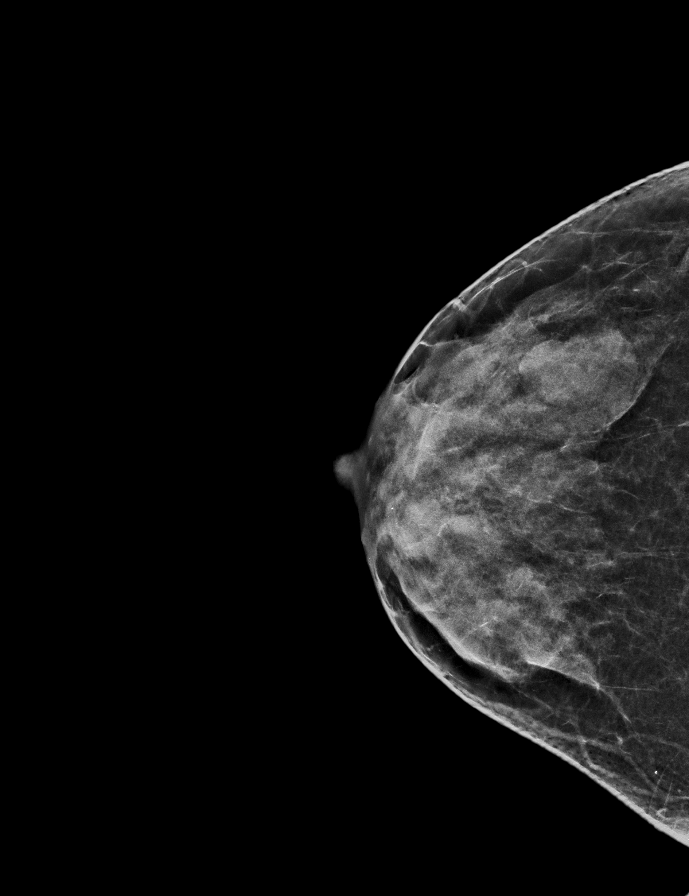

[L CC synth-2D]
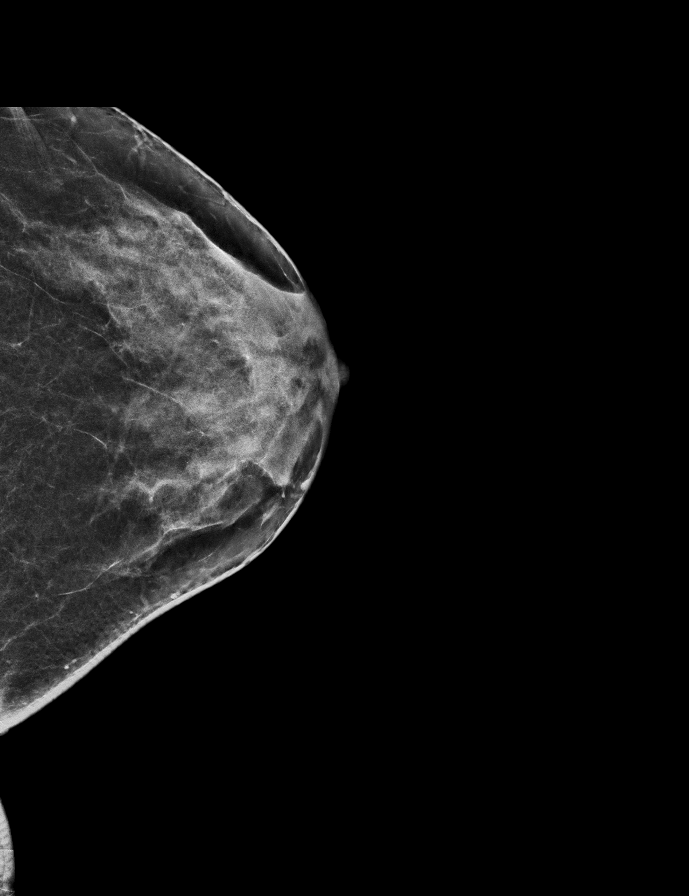

[L MLO synth-2D]
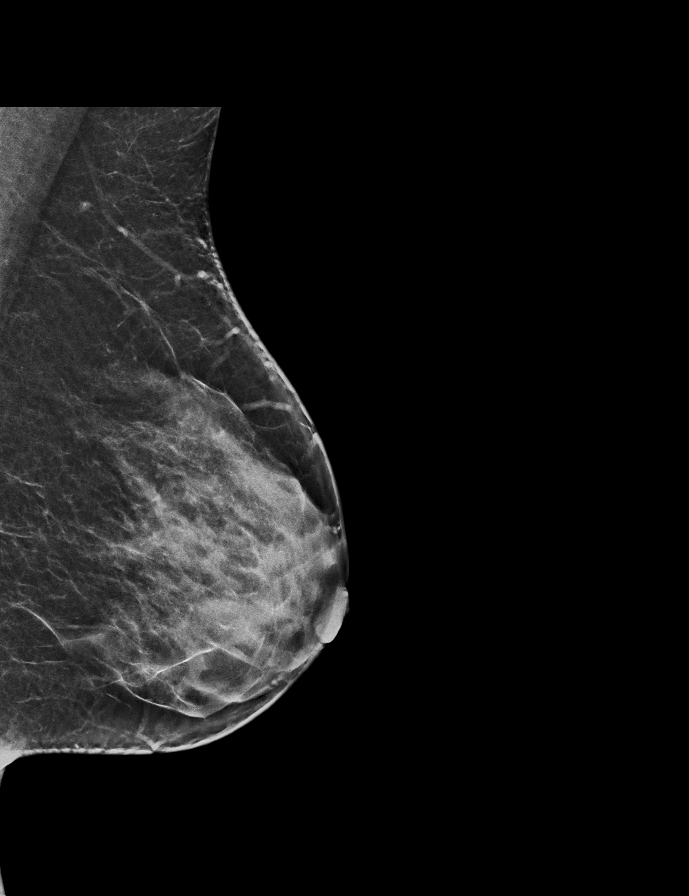

[R MLO tomo · 2 of 59 frames shown]
[frame 20/59]
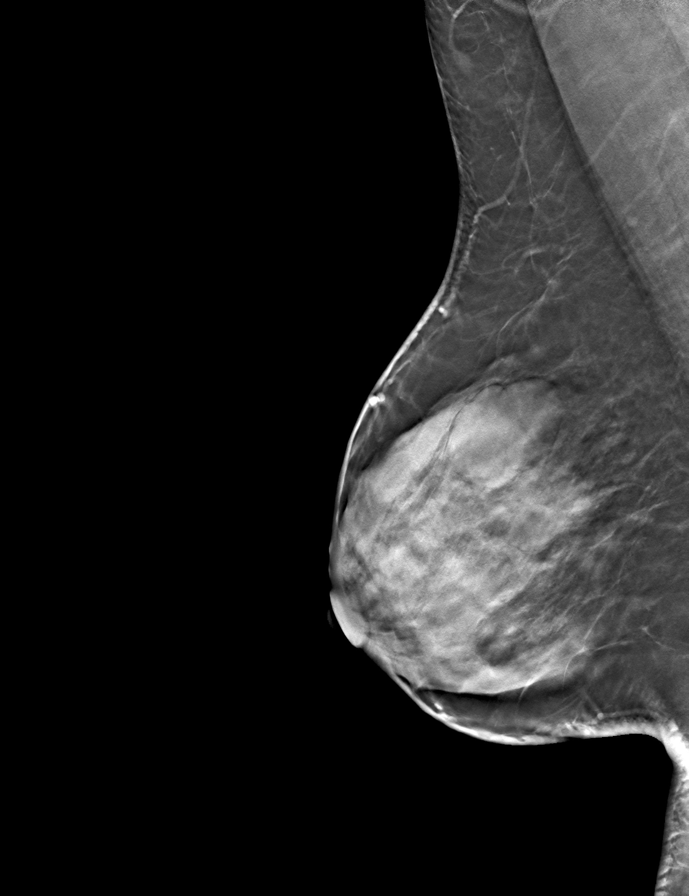
[frame 30/59]
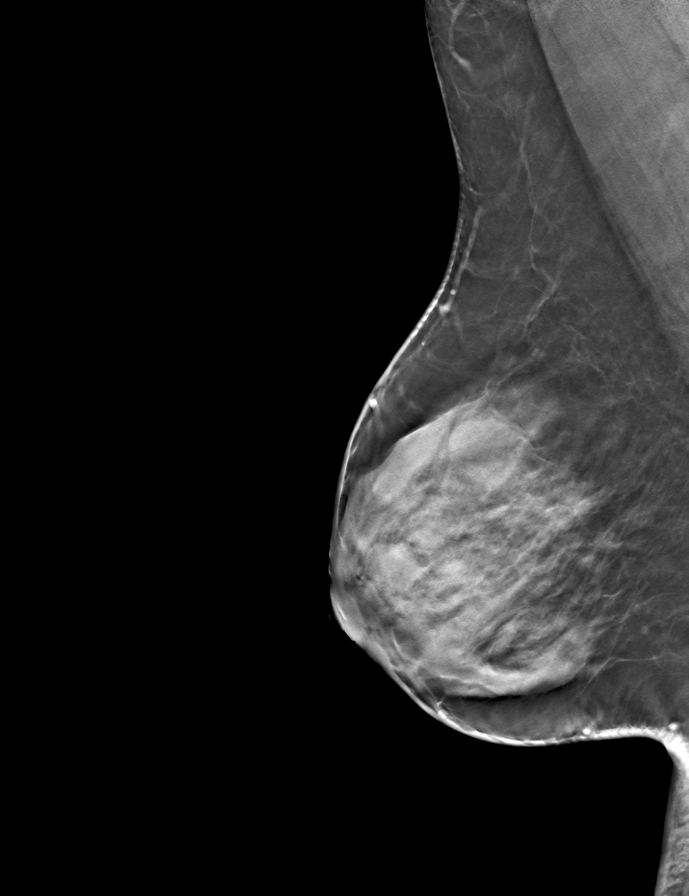

[L CC tomo · tomo slice 27/54.0]
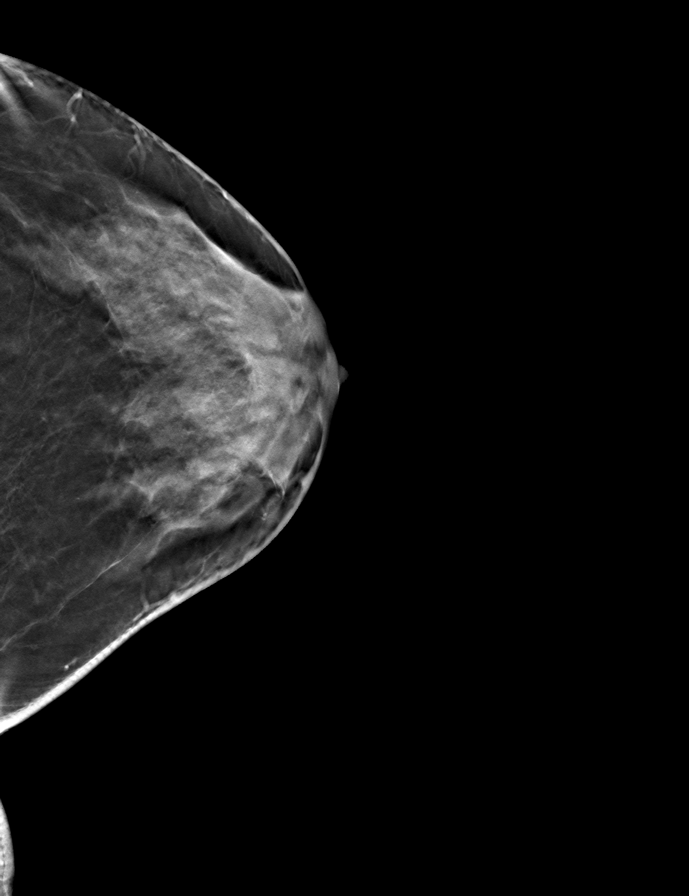

[R CC tomo · tomo slice 25/48.0]
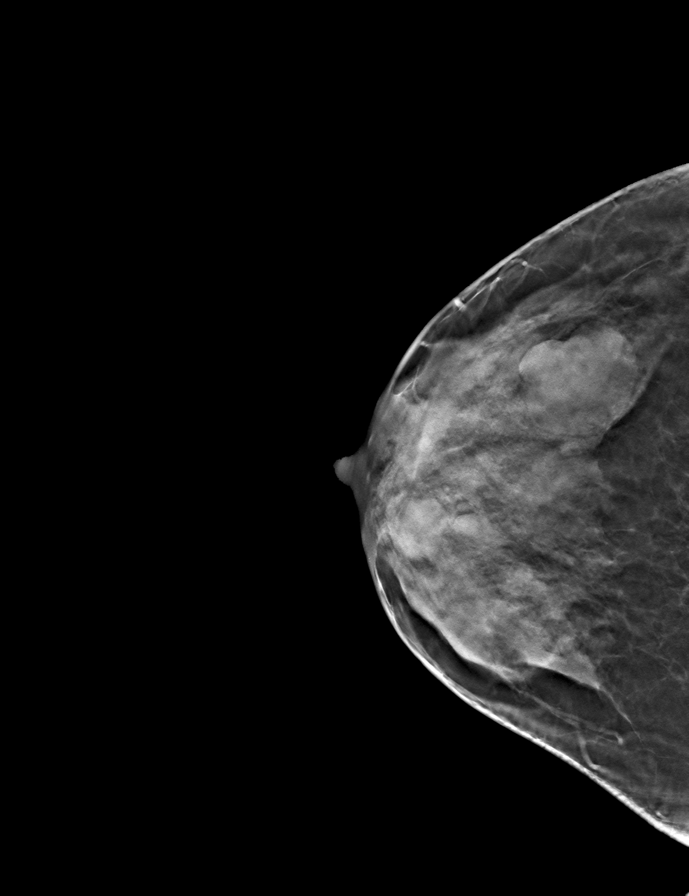

[L MLO tomo · tomo slice 29/57.0]
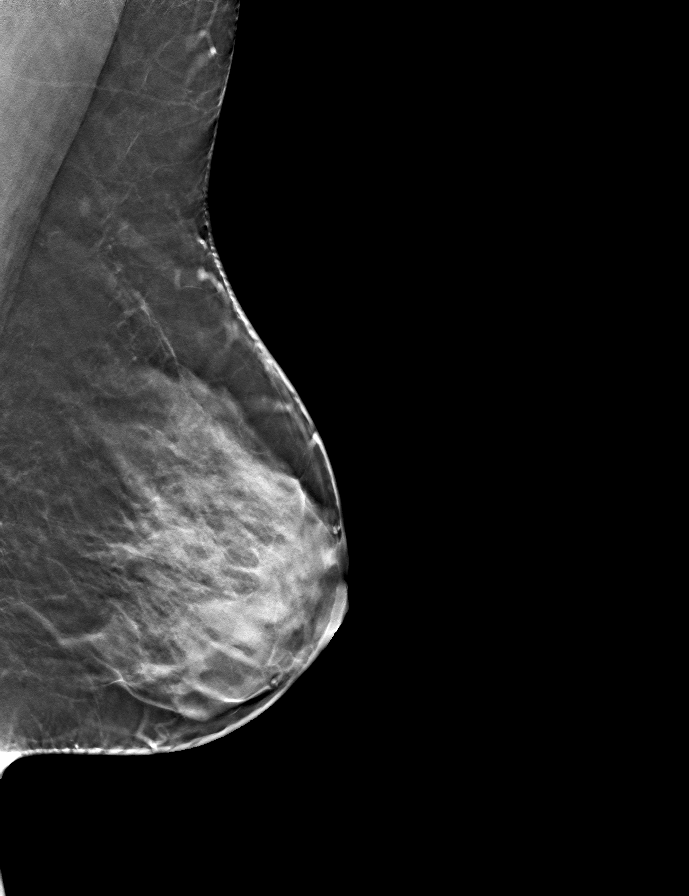

[9 of 24 positions shown; findings below may reference images not displayed]

ACR Breast Density Category d: The breast tissue is extremely dense,
which lowers the sensitivity of mammography
FINDINGS: There are no findings suspicious for malignancy. Images were
processed with CAD.
IMPRESSION: No mammographic evidence of malignancy. A result letter of this
screening mammogram will be mailed directly to the patient.

RECOMMENDATION:
Screening mammogram in one year. (Code:WO-0-ZI0)

BI-RADS CATEGORY  1: Negative.

## 2021-10-16 ENCOUNTER — Encounter: Payer: BC Managed Care – PPO | Admitting: Family Medicine

## 2021-10-16 DIAGNOSIS — Z91199 Patient's noncompliance with other medical treatment and regimen due to unspecified reason: Secondary | ICD-10-CM

## 2021-10-16 NOTE — Progress Notes (Signed)
Called patient and left message at 3:50, 4:29, and 4:38 with no answer or return call. Patient will need to reschedule.

## 2021-11-12 ENCOUNTER — Ambulatory Visit: Payer: Commercial Managed Care - PPO | Admitting: Nurse Practitioner

## 2021-11-12 ENCOUNTER — Encounter: Payer: Self-pay | Admitting: Nurse Practitioner

## 2021-11-12 VITALS — BP 131/88 | HR 88 | Temp 98.7°F | Wt 174.4 lb

## 2021-11-12 DIAGNOSIS — N23 Unspecified renal colic: Secondary | ICD-10-CM | POA: Diagnosis not present

## 2021-11-12 DIAGNOSIS — R300 Dysuria: Secondary | ICD-10-CM

## 2021-11-12 LAB — URINALYSIS, COMPLETE
Bilirubin, UA: NEGATIVE
Glucose, UA: NEGATIVE
Nitrite, UA: NEGATIVE
Specific Gravity, UA: >1.03 — ABNORMAL HIGH (ref 1.005–1.030)
Urobilinogen, Ur: 1 mg/dL (ref 0.2–1.0)
pH, UA: 5.5 (ref 5.0–7.5)

## 2021-11-12 LAB — MICROSCOPIC EXAMINATION: Renal Epithel, UA: NONE SEEN /hpf

## 2021-11-12 MED ORDER — SULFAMETHOXAZOLE-TRIMETHOPRIM 800-160 MG PO TABS: 1.0000 | ORAL_TABLET | Freq: Two times a day (BID) | ORAL | 0 refills | Status: DC

## 2021-11-12 NOTE — Patient Instructions (Signed)

## 2021-11-12 NOTE — Progress Notes (Signed)
Acute Office Visit  Subjective:     Patient ID: Meghan Foster, female    DOB: 20-Feb-1976, 46 y.o.   MRN: [id number]  Chief Complaint  Patient presents with   Flank Pain    Urinary Tract Infection  This is a new problem. The current episode started yesterday. The problem has been gradually worsening. The quality of the pain is described as aching. The pain is moderate. There has been no fever. She is Sexually active. There is No history of pyelonephritis. Associated symptoms include flank pain and nausea. Pertinent negatives include no chills, frequency or hematuria. Treatments tried: Azo otc. The treatment provided mild relief.     Review of Systems  Constitutional: Negative.  Negative for chills and fever.  HENT: Negative.    Respiratory: Negative.    Cardiovascular: Negative.   Gastrointestinal:  Positive for nausea.  Genitourinary:  Positive for flank pain. Negative for frequency and hematuria.  Skin: Negative.  Negative for itching and rash.  All other systems reviewed and are negative.       Objective:    BP 131/88   Pulse 88   Temp 98.7 F (37.1 C)   Wt 174 lb 6.4 oz (79.1 kg)   SpO2 93%   BMI 31.90 kg/m  BP Readings from Last 3 Encounters:  11/12/21 131/88  05/19/21 110/82  06/19/20 110/78   Wt Readings from Last 3 Encounters:  11/12/21 174 lb 6.4 oz (79.1 kg)  05/19/21 170 lb (77.1 kg)  06/19/20 162 lb 12.8 oz (73.8 kg)      Physical Exam Vitals and nursing note reviewed.  Constitutional:      Appearance: Normal appearance.  HENT:     Head: Normocephalic.     Right Ear: External ear normal.     Left Ear: External ear normal.     Nose: Nose normal.     Mouth/Throat:     Mouth: Mucous membranes are moist.     Pharynx: Oropharynx is clear.  Eyes:     Conjunctiva/sclera: Conjunctivae normal.  Cardiovascular:     Rate and Rhythm: Normal rate and regular rhythm.     Pulses: Normal pulses.     Heart sounds: Normal heart sounds.  Pulmonary:      Effort: Pulmonary effort is normal.     Breath sounds: Normal breath sounds.  Abdominal:     General: Bowel sounds are normal.     Tenderness: There is left CVA tenderness. There is no right CVA tenderness.  Skin:    General: Skin is warm.     Findings: No erythema or rash.  Neurological:     Mental Status: She is alert and oriented to person, place, and time.     No results found for any visits on 11/12/21.      Assessment & Plan:    Appears well, in no apparent distress.  Vital signs are normal. The abdomen is soft without tenderness, guarding, mass, rebound or organomegaly. Positive for left  CVA tenderness no inguinal adenopathy noted. Urine dipstick shows positive for WBC's, positive for RBC's, and positive for leukocytes.  Micro exam: many+ bacteria.   UTI uncomplicated without evidence of pyelonephritis  PLAN: Treatment per orders - also push fluids, may use Pyridium OTC prn. Started patient on bactrim DS, urine cultures pending. Call or return to clinic prn if these symptoms worsen or fail to improve as anticipated.     Problem List Items Addressed This Visit   None Visit Diagnoses  Dysuria    -  Primary   Kidney pain       Relevant Medications   sulfamethoxazole-trimethoprim (BACTRIM DS) 800-160 MG tablet   Other Relevant Orders   Urinalysis, Complete   Urine Culture       Meds ordered this encounter  Medications   sulfamethoxazole-trimethoprim (BACTRIM DS) 800-160 MG tablet    Sig: Take 1 tablet by mouth 2 (two) times daily.    Dispense:  20 tablet    Refill:  0    Order Specific Question:   Supervising Provider    Answer:   Mechele Claude [[id number]]    Return if symptoms worsen or fail to improve.  Daryll Drown, NP

## 2021-11-13 LAB — URINE CULTURE: Organism ID, Bacteria: NO GROWTH

## 2022-01-13 ENCOUNTER — Encounter: Payer: Self-pay | Admitting: Nurse Practitioner

## 2022-01-13 ENCOUNTER — Telehealth: Payer: Commercial Managed Care - PPO | Admitting: Nurse Practitioner

## 2022-01-13 DIAGNOSIS — Z91199 Patient's noncompliance with other medical treatment and regimen due to unspecified reason: Secondary | ICD-10-CM

## 2022-01-13 NOTE — Progress Notes (Signed)
Patient did not show for appointment on initial visit

## 2022-01-14 ENCOUNTER — Ambulatory Visit (INDEPENDENT_AMBULATORY_CARE_PROVIDER_SITE_OTHER): Payer: Commercial Managed Care - PPO | Admitting: Nurse Practitioner

## 2022-01-14 ENCOUNTER — Other Ambulatory Visit (INDEPENDENT_AMBULATORY_CARE_PROVIDER_SITE_OTHER): Payer: Commercial Managed Care - PPO

## 2022-01-14 ENCOUNTER — Ambulatory Visit: Payer: Commercial Managed Care - PPO

## 2022-01-14 ENCOUNTER — Encounter: Payer: Self-pay | Admitting: Nurse Practitioner

## 2022-01-14 ENCOUNTER — Telehealth: Payer: Self-pay | Admitting: Family Medicine

## 2022-01-14 DIAGNOSIS — R109 Unspecified abdominal pain: Secondary | ICD-10-CM

## 2022-01-14 DIAGNOSIS — R300 Dysuria: Secondary | ICD-10-CM

## 2022-01-14 LAB — URINALYSIS, ROUTINE W REFLEX MICROSCOPIC
Bilirubin, UA: NEGATIVE
Glucose, UA: NEGATIVE
Nitrite, UA: NEGATIVE
Specific Gravity, UA: 1.025 (ref 1.005–1.030)
Urobilinogen, Ur: 1 mg/dL (ref 0.2–1.0)
pH, UA: 6.5 (ref 5.0–7.5)

## 2022-01-14 LAB — MICROSCOPIC EXAMINATION: Renal Epithel, UA: NONE SEEN /hpf

## 2022-01-14 MED ORDER — CEPHALEXIN 500 MG PO CAPS: 500.0000 mg | ORAL_CAPSULE | Freq: Two times a day (BID) | ORAL | 0 refills | Status: DC

## 2022-01-14 NOTE — Telephone Encounter (Signed)
Pt has been scheduled.  °

## 2022-01-14 NOTE — Progress Notes (Signed)
   Virtual Visit  Note Due to COVID-19 pandemic this visit was conducted virtually. This visit type was conducted due to national recommendations for restrictions regarding the COVID-19 Pandemic (e.g. social distancing, sheltering in place) in an effort to limit this patient's exposure and mitigate transmission in our community. All issues noted in this document were discussed and addressed.  A physical exam was not performed with this format.  I connected with Meghan Foster on 01/14/22 at 3:00 pm  by telephone and verified that I am speaking with the correct person using two identifiers. Meghan Foster is currently located at home during visit. The provider, Daryll Drown, NP is located in their office at time of visit.  I discussed the limitations, risks, security and privacy concerns of performing an evaluation and management service by telephone and the availability of in person appointments. I also discussed with the patient that there may be a patient responsible charge related to this service. The patient expressed understanding and agreed to proceed.   History and Present Illness:  Dysuria  This is a new problem. The current episode started yesterday. The problem occurs every urination. The problem has been unchanged. The quality of the pain is described as aching. The pain is moderate. There has been no fever. Associated symptoms include flank pain. Pertinent negatives include no chills, hematuria, nausea or vomiting. She has tried nothing for the symptoms.  Flank Pain This is a new problem. The current episode started yesterday. The problem occurs constantly. The problem is unchanged. The quality of the pain is described as aching. The pain is moderate. Associated symptoms include dysuria. Pertinent negatives include no abdominal pain. She has tried nothing for the symptoms.      Review of Systems  Constitutional:  Negative for chills.  HENT: Negative.    Respiratory:  Negative.    Cardiovascular: Negative.   Gastrointestinal:  Negative for abdominal pain, nausea and vomiting.  Genitourinary:  Positive for dysuria and flank pain. Negative for hematuria.  Skin: Negative.  Negative for rash.  All other systems reviewed and are negative.    Observations/Objective: Televisit patient not in distress.  Assessment and Plan:  UTI  vs  interstitial cystitis -urinalysis completed results positive for leukocytes, nitrates and bacteria. -Urine culture is pending. -pyridium for pain -Keflex Precaution and education provided -All questions answered -follow up with unresolved symptoms   Follow Up Instructions: Follow-up with unresolved symptoms    I discussed the assessment and treatment plan with the patient. The patient was provided an opportunity to ask questions and all were answered. The patient agreed with the plan and demonstrated an understanding of the instructions.   The patient was advised to call back or seek an in-person evaluation if the symptoms worsen or if the condition fails to improve as anticipated.  The above assessment and management plan was discussed with the patient. The patient verbalized understanding of and has agreed to the management plan. Patient is aware to call the clinic if symptoms persist or worsen. Patient is aware when to return to the clinic for a follow-up visit. Patient educated on when it is appropriate to go to the emergency department.   Time call ended: 3:12 PM  I provided 12 minutes of  non face-to-face time during this encounter.    Daryll Drown, NP

## 2022-01-14 NOTE — Patient Instructions (Signed)
Flank Pain, Adult ?Flank pain is pain in your side. The flank is the area on your side between your upper belly (abdomen) and your spine. The pain may occur over a short time (acute), or it may be long-term or come back often (chronic). It may be mild or very bad. Pain in this area can be caused by many different things. ?Follow these instructions at home: ? ?Drink enough fluid to keep your pee (urine) pale yellow. ?Rest as told by your doctor. ?Take over-the-counter and prescription medicines only as told by your doctor. ?Keep a journal to keep track of: ?What has caused your flank pain. ?What has made your flank pain feel better. ?Keep all follow-up visits. ?Contact a doctor if: ?Medicine does not help your pain. ?You have new symptoms. ?Your pain gets worse. ?Your symptoms last longer than 2-3 days. ?You have trouble peeing. ?You are peeing more often than normal. ?Get help right away if: ?You have trouble breathing. ?You are short of breath. ?Your belly hurts, or it is swollen or red. ?You feel like you may vomit (nauseous). ?You vomit. ?You feel faint, or you faint. ?You have blood in your pee. ?You have flank pain and a fever. ?These symptoms may be an emergency. Get help right away. Call your local emergency services (911 in the [place] ?Do not wait to see if the symptoms will go away. ?Do not drive yourself to the hospital. ?Summary ?Flank pain is pain in your side. The flank is the area of your side between your upper belly (abdomen) and your spine. ?Flank pain may occur over a short time (acute), or it may be long-term or come back often (chronic). It may be mild or very bad. ?Pain in this area can be caused by many different things. ?Contact your doctor if your symptoms get worse or last longer than 2-3 days. ?This information is not intended to replace advice given to you by your health care provider. Make sure you discuss any questions you have with your health care provider. ?Document Revised:  05/06/2020 Document Reviewed: 05/06/2020 ?Elsevier Patient Education ? 2023 Elsevier Inc. ? ?

## 2022-01-14 NOTE — Telephone Encounter (Signed)
You can put her on my schedule today for phone visit

## 2022-01-15 ENCOUNTER — Ambulatory Visit: Payer: Commercial Managed Care - PPO | Admitting: Family Medicine

## 2022-01-16 ENCOUNTER — Telehealth: Payer: Self-pay | Admitting: Family Medicine

## 2022-01-16 NOTE — Telephone Encounter (Signed)
Spoke with pt , she did get medication , error at pharmacy

## 2022-01-18 ENCOUNTER — Other Ambulatory Visit: Payer: Self-pay | Admitting: Nurse Practitioner

## 2022-01-18 MED ORDER — TAMSULOSIN HCL 0.4 MG PO CAPS: 0.4000 mg | ORAL_CAPSULE | Freq: Every day | ORAL | 0 refills | Status: DC

## 2022-01-19 ENCOUNTER — Other Ambulatory Visit: Payer: Self-pay | Admitting: Nurse Practitioner

## 2022-01-19 DIAGNOSIS — N390 Urinary tract infection, site not specified: Secondary | ICD-10-CM

## 2022-01-19 LAB — CULTURE, URINE COMPREHENSIVE

## 2022-01-19 MED ORDER — CIPROFLOXACIN HCL 250 MG PO TABS: 250.0000 mg | ORAL_TABLET | Freq: Two times a day (BID) | ORAL | 0 refills | Status: AC

## 2022-01-23 ENCOUNTER — Other Ambulatory Visit: Payer: Self-pay | Admitting: Nurse Practitioner

## 2022-01-23 ENCOUNTER — Telehealth: Payer: Self-pay | Admitting: Family Medicine

## 2022-01-23 DIAGNOSIS — N342 Other urethritis: Secondary | ICD-10-CM

## 2022-01-23 MED ORDER — SULFAMETHOXAZOLE-TRIMETHOPRIM 800-160 MG PO TABS: 1.0000 | ORAL_TABLET | Freq: Two times a day (BID) | ORAL | 0 refills | Status: DC

## 2022-01-23 NOTE — Telephone Encounter (Signed)
We clarified new medication order.  Patient is aware. No need to call patient

## 2022-01-23 NOTE — Telephone Encounter (Signed)
Bactrim sent to pharmacy

## 2022-01-23 NOTE — Telephone Encounter (Signed)
Patient aware and verbalized understanding. °

## 2022-02-12 ENCOUNTER — Other Ambulatory Visit: Payer: Self-pay | Admitting: Nurse Practitioner
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
## 2022-02-24 ENCOUNTER — Ambulatory Visit: Payer: Self-pay | Admitting: Obstetrics and Gynecology

## 2022-04-17 ENCOUNTER — Telehealth: Payer: Self-pay | Admitting: Family Medicine

## 2022-04-17 ENCOUNTER — Encounter: Payer: Self-pay | Admitting: Family Medicine

## 2022-04-17 ENCOUNTER — Telehealth: Payer: Commercial Managed Care - PPO | Admitting: Family Medicine

## 2022-04-17 DIAGNOSIS — U071 COVID-19: Secondary | ICD-10-CM | POA: Diagnosis not present

## 2022-04-17 NOTE — Telephone Encounter (Signed)
Ok to update note for this.

## 2022-04-17 NOTE — Telephone Encounter (Signed)
Patient had an appt on 2/9, needs her letter rewritten to state that she is COVID positive. She said she was contacted by her HR department and was told that it needed to state this. Please call when this has been updated.

## 2022-04-17 NOTE — Progress Notes (Signed)
   Virtual Visit via video Note   Due to COVID-19 pandemic this visit was conducted virtually. This visit type was conducted due to national recommendations for restrictions regarding the COVID-19 Pandemic (e.g. social distancing, sheltering in place) in an effort to limit this patient's exposure and mitigate transmission in our community. All issues noted in this document were discussed and addressed.  A physical exam was not performed with this format.  I connected with  Marcy Sookdeo  on 04/17/22 at 12:05 by video and verified that I am speaking with the correct person using two identifiers. Kathia Covington is currently located at home and no one is currently with her during the visit. The provider, Gwenlyn Perking, FNP is located in their office at time of visit.  I discussed the limitations, risks, security and privacy concerns of performing an evaluation and management service by video  and the availability of in person appointments. I also discussed with the patient that there may be a patient responsible charge related to this service. The patient expressed understanding and agreed to proceed.  CC: URI  History and Present Illness: Upper Respiratory Infection: Patient complains of symptoms of a URI. Symptoms include congestion, cough, diarrhea, sore throat, and chills, and fatigue . Onset of symptoms was 2 days ago, gradually worsening since that time. She is drinking plenty of fluids. Evaluation to date: none. Treatment to date: cough suppressants and decongestants. She has had a positive home Covid test.    ROS As per HPI.   Observations/Objective: Alert and oriented. Respirations unlabored. No cyanosis. Non toxic appearing. Normal mood and behavior.   Assessment and Plan: Shadoe was seen today for uri.  Diagnoses and all orders for this visit:  COVID-19 Discussed symptomatic care and return precautions. Discussed quarantine. Work note provided.    Follow Up  Instructions: Return to office for new or worsening symptoms, or if symptoms persist.     I discussed the assessment and treatment plan with the patient. The patient was provided an opportunity to ask questions and all were answered. The patient agreed with the plan and demonstrated an understanding of the instructions.   The patient was advised to call back or seek an in-person evaluation if the symptoms worsen or if the condition fails to improve as anticipated.  The above assessment and management plan was discussed with the patient. The patient verbalized understanding of and has agreed to the management plan. Patient is aware to call the clinic if symptoms persist or worsen. Patient is aware when to return to the clinic for a follow-up visit. Patient educated on when it is appropriate to go to the emergency department.   Time call ended: 12:10  I provided 5 minutes of face-to-face time during this encounter.    Gwenlyn Perking, FNP

## 2022-04-21 NOTE — Telephone Encounter (Addendum)
Left message for pt to return call.   Pt had video visit with Tiffany on 2/9. Pt had a positive home test during that visit.  When was the first day pt missed work and when did she return?

## 2022-04-22 NOTE — Telephone Encounter (Signed)
Pt first day off was 04/16/2022 and she returned on 04/21/2022 Work note needs to state she was off due to [place]

## 2022-05-04 ENCOUNTER — Telehealth: Payer: Self-pay | Admitting: Family Medicine

## 2022-05-04 NOTE — Telephone Encounter (Signed)
Does pt ntbs? She hasn't had any labs since 05/2021

## 2022-05-04 NOTE — Telephone Encounter (Signed)
REFERRAL REQUEST Telephone Note  Have you been seen at our office for this problem? Yes 01/15/2023 (Advise that they may need an appointment with their PCP before a referral can be done)  Reason for Referral: nephrologist Referral discussed with patient: no  Best contact number of patient for referral team: [phone]    Has patient been seen by a specialist for this issue before: yes when pt had lupus. She is in remission now.  Patient provider preference for referral: NO Patient location preference for referral: [place]   Patient notified that referrals can take up to a week or longer to process. If they haven't heard anything within a week they should call back and speak with the referral department.

## 2022-05-06 NOTE — Telephone Encounter (Signed)
Apt scheduled 06/17/2022

## 2022-05-06 NOTE — Telephone Encounter (Signed)
Yes patient does need an appointment, has not been seen by me in quite some time, cannot place referral without documentation.

## 2022-05-06 NOTE — Telephone Encounter (Signed)
Please schedule next available with Dr. Warrick Parisian. The 11/8 visit was with Je. Needs to be evaluated by [place] before referral can be placed.

## 2022-06-17 ENCOUNTER — Ambulatory Visit: Payer: Commercial Managed Care - PPO | Admitting: Family Medicine

## 2022-06-17 VITALS — BP 131/87 | HR 83 | Ht 62.0 in | Wt 174.0 lb

## 2022-06-17 DIAGNOSIS — M3214 Glomerular disease in systemic lupus erythematosus: Secondary | ICD-10-CM

## 2022-06-17 DIAGNOSIS — R8290 Unspecified abnormal findings in urine: Secondary | ICD-10-CM

## 2022-06-17 DIAGNOSIS — F419 Anxiety disorder, unspecified: Secondary | ICD-10-CM

## 2022-06-17 DIAGNOSIS — Z87442 Personal history of urinary calculi: Secondary | ICD-10-CM | POA: Diagnosis not present

## 2022-06-17 LAB — URINALYSIS
Bilirubin, UA: NEGATIVE
Glucose, UA: NEGATIVE
Ketones, UA: NEGATIVE
Leukocytes,UA: NEGATIVE
Nitrite, UA: NEGATIVE
Protein,UA: NEGATIVE
Specific Gravity, UA: 1.01 (ref 1.005–1.030)
Urobilinogen, Ur: 0.2 mg/dL (ref 0.2–1.0)
pH, UA: 7 (ref 5.0–7.5)

## 2022-06-17 MED ORDER — BUPROPION HCL ER (XL) 150 MG PO TB24
150.0000 mg | ORAL_TABLET | Freq: Every day | ORAL | 1 refills | Status: DC
Start: 2022-06-17 — End: 2022-07-01

## 2022-06-17 NOTE — Progress Notes (Signed)
BP 131/87   Pulse 83   Ht 5\' 2"  (1.575 m)   Wt 174 lb (78.9 kg)   SpO2 95%   BMI 31.83 kg/m    Subjective:   Patient ID: Meghan Foster, female    DOB: 23-Jan-1976, 47 y.o.   MRN: [id number]  HPI: Meghan Foster is a 47 y.o. female presenting on 06/17/2022 for Medical Management of Chronic Issues, Lupus, Nephrolithiasis, and Urinary Tract Infection (Urine is cloudy and has an odor- would like to be checked for UTI)   HPI Lupus Patient is coming in to reestablish for her lupus.  She says that she has been having more fatigue and some joint issues in her hands and her feet and decreased energy and feeling tired and feeling more anxious.  She says is all been increasing over the past few months, she would like a referral back to nephrology for management of her lupus.  She is concerned that she may be flaring up and has not had blood work or testing for it in some time.  She is making urine but she says she does sometimes have an order and frequency.  Patient is also coming in to discuss anxiety.  She says she has been a lot more stress and feeling stressed from work and feeling overwhelmed.  She says she is not sleeping as well because of her stress and anxiety and mental state.  She says this is all been increasing as well over the past couple years.  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  She says she did try something for anxiety in the past and feels like it did help but then let it lapse and did not follow-up and did not get refills for it.  Patient denies any suicidal ideations or thoughts of hurting herself.    06/17/2022   11:01 AM 05/19/2021    8:33 AM 06/19/2020    8:15 AM 12/15/2019    4:29 PM 09/05/2019    8:06 AM  Depression screen PHQ 2/9  Decreased Interest 0 0 0 0 0  Down, Depressed, Hopeless 0 0 0 0 0  PHQ - 2 Score 0 0 0 0 0  Altered sleeping 1      Tired, decreased energy 1      Change in  appetite 0      Feeling bad or failure about yourself  0      Trouble concentrating 0      Moving slowly or fidgety/restless 0      Suicidal thoughts 0      PHQ-9 Score 2      Difficult doing work/chores Not difficult at all         Relevant past medical, surgical, family and social history reviewed and updated as indicated. Interim medical history since our last visit reviewed. Allergies and medications reviewed and updated.  Review of Systems  Constitutional:  Negative for chills and fever.  Eyes:  Negative for redness and visual disturbance.  Respiratory:  Negative for chest tightness and shortness of breath.   Cardiovascular:  Negative for chest pain and leg swelling.  Genitourinary:  Negative for difficulty urinating and dysuria.  Musculoskeletal:  Positive for arthralgias. Negative for back pain and gait problem.  Skin:  Negative for rash.  Neurological:  Negative for dizziness, light-headedness and headaches.  Psychiatric/Behavioral:  Positive for dysphoric mood and sleep disturbance. Negative for agitation, behavioral  problems, self-injury and suicidal ideas. The patient is nervous/anxious.   All other systems reviewed and are negative.   Per HPI unless specifically indicated above   Allergies as of 06/17/2022   No Known Allergies      Medication List        Accurate as of June 17, 2022 12:08 PM. If you have any questions, ask your nurse or doctor.          STOP taking these medications    sulfamethoxazole-trimethoprim 800-160 MG tablet Commonly known as: Bactrim DS Stopped by: Elige Radon Mikayla Chiusano, MD   tamsulosin 0.4 MG Caps capsule Commonly known as: FLOMAX Stopped by: Elige Radon Vestal Crandall, MD       TAKE these medications    buPROPion 150 MG 24 hr tablet Commonly known as: Wellbutrin XL Take 1 tablet (150 mg total) by mouth daily. Started by: Elige Radon Tymira Horkey, MD   Liletta (52 MG) 20.1 MCG/DAY Iud IUD Generic drug: levonorgestrel 1 each by  Intrauterine route once.         Objective:   BP 131/87   Pulse 83   Ht 5\' 2"  (1.575 m)   Wt 174 lb (78.9 kg)   SpO2 95%   BMI 31.83 kg/m   Wt Readings from Last 3 Encounters:  06/17/22 174 lb (78.9 kg)  11/12/21 174 lb 6.4 oz (79.1 kg)  05/19/21 170 lb (77.1 kg)    Physical Exam Vitals and nursing note reviewed.  Constitutional:      General: She is not in acute distress.    Appearance: She is well-developed. She is not diaphoretic.  Eyes:     Conjunctiva/sclera: Conjunctivae normal.  Cardiovascular:     Rate and Rhythm: Normal rate and regular rhythm.     Heart sounds: Normal heart sounds. No murmur heard. Pulmonary:     Effort: Pulmonary effort is normal. No respiratory distress.     Breath sounds: Normal breath sounds. No wheezing.  Musculoskeletal:        General: No swelling or tenderness. Normal range of motion.  Skin:    General: Skin is warm and dry.     Findings: No rash.  Neurological:     Mental Status: She is alert and oriented to person, place, and time.     Coordination: Coordination normal.  Psychiatric:        Behavior: Behavior normal.     Assessment & Plan:   Problem List Items Addressed This Visit       Other   Lupus (systemic lupus erythematosus) - Primary   Relevant Orders   Ambulatory referral to Nephrology   CBC with Differential/Platelet   CMP14+EGFR   Lipid panel   TSH   Systemic Lupus Profile A   History of kidney stones   Other Visit Diagnoses     Abnormal urine odor       Relevant Orders   Urinalysis   Urine Culture   Cloudy urine       Relevant Orders   Urinalysis   Urine Culture   Anxiety       Relevant Medications   buPROPion (WELLBUTRIN XL) 150 MG 24 hr tablet       Will restart Wellbutrin and have her come back in 1 to 2 months to see how she is doing on it.  Did referral to nephrology for her lupus and will do some testing today. Follow up plan: Return if symptoms worsen or fail to improve, for 1 to  70-month  anxiety recheck..  Counseling provided for all of the vaccine components Orders Placed This Encounter  Procedures   Urine Culture   Urinalysis   CBC with Differential/Platelet   CMP14+EGFR   Lipid panel   TSH   Systemic Lupus Profile A   Ambulatory referral to Nephrology    Arville Care, MD [facility] 06/17/2022, 12:08 PM

## 2022-06-18 LAB — CBC WITH DIFFERENTIAL/PLATELET
Basophils Absolute: 0.1 10*3/uL (ref 0.0–0.2)
Basos: 1 %
EOS (ABSOLUTE): 0.1 10*3/uL (ref 0.0–0.4)
Eos: 1 %
Hematocrit: 40.3 % (ref 34.0–46.6)
Hemoglobin: 12.9 g/dL (ref 11.1–15.9)
Immature Grans (Abs): 0.1 10*3/uL (ref 0.0–0.1)
Immature Granulocytes: 1 %
Lymphocytes Absolute: 2.8 10*3/uL (ref 0.7–3.1)
Lymphs: 28 %
MCH: 27.3 pg (ref 26.6–33.0)
MCHC: 32 g/dL (ref 31.5–35.7)
MCV: 85 fL (ref 79–97)
Monocytes Absolute: 0.7 10*3/uL (ref 0.1–0.9)
Monocytes: 7 %
Neutrophils Absolute: 6.3 10*3/uL (ref 1.4–7.0)
Neutrophils: 62 %
Platelets: 364 10*3/uL (ref 150–450)
RBC: 4.73 x10E6/uL (ref 3.77–5.28)
RDW: 12.7 % (ref 11.7–15.4)
WBC: 10.1 10*3/uL (ref 3.4–10.8)

## 2022-06-18 LAB — CMP14+EGFR
ALT: 23 IU/L (ref 0–32)
AST: 21 IU/L (ref 0–40)
Albumin/Globulin Ratio: 1.4 (ref 1.2–2.2)
Albumin: 4 g/dL (ref 3.9–4.9)
Alkaline Phosphatase: 83 IU/L (ref 44–121)
BUN/Creatinine Ratio: 16 (ref 9–23)
BUN: 11 mg/dL (ref 6–24)
Bilirubin Total: 0.4 mg/dL (ref 0.0–1.2)
CO2: 23 mmol/L (ref 20–29)
Calcium: 9.2 mg/dL (ref 8.7–10.2)
Chloride: 102 mmol/L (ref 96–106)
Creatinine, Ser: 0.7 mg/dL (ref 0.57–1.00)
Globulin, Total: 2.8 g/dL (ref 1.5–4.5)
Glucose: 83 mg/dL (ref 70–99)
Potassium: 4.6 mmol/L (ref 3.5–5.2)
Sodium: 138 mmol/L (ref 134–144)
Total Protein: 6.8 g/dL (ref 6.0–8.5)
eGFR: 107 mL/min/{1.73_m2} (ref >59)

## 2022-06-18 LAB — URINE CULTURE: Organism ID, Bacteria: NO GROWTH

## 2022-06-18 LAB — LIPID PANEL
Chol/HDL Ratio: 3.9 ratio (ref 0.0–4.4)
Cholesterol, Total: 182 mg/dL (ref 100–199)
HDL: 47 mg/dL (ref >39)
LDL Chol Calc (NIH): 113 mg/dL — ABNORMAL HIGH (ref 0–99)
Triglycerides: 123 mg/dL (ref 0–149)
VLDL Cholesterol Cal: 22 mg/dL (ref 5–40)

## 2022-06-18 LAB — SYSTEMIC LUPUS PROFILE A
Chromatin Ab SerPl-aCnc: <0.2 AI (ref 0.0–0.9)
ENA RNP Ab: <0.2 AI (ref 0.0–0.9)
ENA SM Ab Ser-aCnc: <0.2 AI (ref 0.0–0.9)
ENA SSA (RO) Ab: 3.3 AI — ABNORMAL HIGH (ref 0.0–0.9)
ENA SSB (LA) Ab: <0.2 AI (ref 0.0–0.9)
Rheumatoid fact SerPl-aCnc: <10 IU/mL (ref <14.0)
dsDNA Ab: <1 IU/mL (ref 0–9)

## 2022-06-18 LAB — TSH: TSH: 1.54 u[IU]/mL (ref 0.450–4.500)

## 2022-06-19 ENCOUNTER — Other Ambulatory Visit: Payer: Self-pay | Admitting: Family Medicine

## 2022-06-19 DIAGNOSIS — N200 Calculus of kidney: Secondary | ICD-10-CM

## 2022-06-19 DIAGNOSIS — R1032 Left lower quadrant pain: Secondary | ICD-10-CM

## 2022-06-19 NOTE — Progress Notes (Signed)
Put in an order for 4 renal stone CT scan study.  We will follow-up after it is performed

## 2022-07-01 ENCOUNTER — Other Ambulatory Visit: Payer: Self-pay | Admitting: Family Medicine

## 2022-07-01 DIAGNOSIS — F419 Anxiety disorder, unspecified: Secondary | ICD-10-CM

## 2022-07-06 ENCOUNTER — Telehealth: Payer: Self-pay | Admitting: Family Medicine

## 2022-07-06 NOTE — Telephone Encounter (Signed)
Read the note from PCP to patient but she wants to talk to Morrie Sheldon (PCP nurse) about this.

## 2022-07-06 NOTE — Telephone Encounter (Signed)
I think that depends on how much pain she is still having.  If she still having a good amount of pain but I would have her go ahead and do the CT scan.  If she is not having a lot of pain then have her hold off until she sees a specialist

## 2022-07-06 NOTE — Telephone Encounter (Signed)
Left message to call back  

## 2022-07-06 NOTE — Telephone Encounter (Signed)
Patient came in to see PCP on 4/10 for a referral and stated that she has kidney stones, was referred for a CT scan due to left lower quadrant abdominal pain. Would like to speak PCP directly because she wants to know if he wants her to get this CT scan. She said that she found out she will have to pay for this scan and wants to know if it was only ordered because she asked for it to be done or because the PCP thought she needed it. Wants to find out if she still has kidney stones and does not know if she should keep the appointment for scan or if she should wait for an appointment with a kidney doctor. At this time she has not heard back from a kidney doctor but she has a CT scheduled for 5/4, please call back and advise.

## 2022-07-07 NOTE — Telephone Encounter (Signed)
Left message for pt to return call.

## 2022-07-07 NOTE — Telephone Encounter (Signed)
Please call patient back

## 2022-07-08 ENCOUNTER — Other Ambulatory Visit: Payer: Self-pay | Admitting: Nephrology

## 2022-07-08 DIAGNOSIS — M329 Systemic lupus erythematosus, unspecified: Secondary | ICD-10-CM

## 2022-07-08 DIAGNOSIS — E559 Vitamin D deficiency, unspecified: Secondary | ICD-10-CM

## 2022-07-08 DIAGNOSIS — I129 Hypertensive chronic kidney disease with stage 1 through stage 4 chronic kidney disease, or unspecified chronic kidney disease: Secondary | ICD-10-CM

## 2022-07-08 DIAGNOSIS — N200 Calculus of kidney: Secondary | ICD-10-CM

## 2022-07-09 NOTE — Telephone Encounter (Signed)
Okay make sure they send a copy of the ultrasound to [place].

## 2022-07-09 NOTE — Telephone Encounter (Signed)
Patient is going to cancel CT Scan and do the [place] that Dr. Allena Katz ordered on 07-09-2022

## 2022-07-11 ENCOUNTER — Ambulatory Visit (HOSPITAL_BASED_OUTPATIENT_CLINIC_OR_DEPARTMENT_OTHER): Payer: Commercial Managed Care - PPO

## 2022-07-27 ENCOUNTER — Other Ambulatory Visit: Payer: Commercial Managed Care - PPO

## 2022-08-19 ENCOUNTER — Ambulatory Visit: Payer: Commercial Managed Care - PPO | Admitting: Family Medicine
